# Patient Record
Sex: Female | Born: 1980 | Race: Black or African American | Hispanic: No | Marital: Married | Smoking: Never smoker
Health system: Southern US, Community
[De-identification: ages and names within clinical notes are randomized; demographics above are authoritative.]

## PROBLEM LIST (undated history)

## (undated) DIAGNOSIS — O24419 Gestational diabetes mellitus in pregnancy, unspecified control: Secondary | ICD-10-CM

---

## 2006-01-01 ENCOUNTER — Emergency Department (HOSPITAL_COMMUNITY): Admission: EM | Admit: 2006-01-01 | Discharge: 2006-01-01 | Payer: Self-pay | Admitting: Emergency Medicine

## 2006-12-01 ENCOUNTER — Encounter: Admission: RE | Admit: 2006-12-01 | Discharge: 2006-12-01 | Payer: Self-pay | Admitting: Obstetrics and Gynecology

## 2007-02-03 ENCOUNTER — Encounter (INDEPENDENT_AMBULATORY_CARE_PROVIDER_SITE_OTHER): Payer: Self-pay | Admitting: Obstetrics and Gynecology

## 2007-02-03 ENCOUNTER — Inpatient Hospital Stay (HOSPITAL_COMMUNITY): Admission: AD | Admit: 2007-02-03 | Discharge: 2007-02-06 | Payer: Self-pay | Admitting: *Deleted

## 2011-01-21 NOTE — Op Note (Signed)
NAMECHARDAI, Paula Rojas NO.:  [id number]   MEDICAL RECORD NO.:  [id number]          PATIENT TYPE:  INP   LOCATION:  9142                          FACILITY:  [facility]   PHYSICIAN:  Maxie Better, M.D.DATE OF BIRTH:  1981/07/30   DATE OF PROCEDURE:  02/03/2007  DATE OF DISCHARGE:                               OPERATIVE REPORT   PREOPERATIVE DIAGNOSES:  1. Fetal intolerance to labor.  2. Gestational diabetes.   PROCEDURE:  Primary cesarean section, Kerr hysterotomy.   POSTOPERATIVE DIAGNOSES:  1. Fetal intolerance to labor.  2. Direct occiput posterior presentation.  3. Class A1 gestational diabetes   ANESTHESIA:  Spinal.   SURGEON:  Maxie Better, M.D.   ASSISTANT:  Marlinda Mike, CNM   PROCEDURE:  Under adequate spinal anesthesia, the patient was placed in  the supine position with left lateral tilt, an indwelling Foley catheter  already in place.  The patient was sterilely prepped and draped in usual  fashion.  Pfannenstiel skin incision was then made, carried down to the  rectus fascia.  Rectus fascia was opened transversely.  Rectus fascia  was then bluntly and sharply dissected off the rectus muscle in superior  and inferior fashion.  Rectus muscles split in the midline.  The  parietal peritoneum was entered bluntly.  The vesicouterine peritoneum  was opened transversely.  The bladder was then bluntly dissected off the  uterine segment and displaced inferiorly.  A curvilinear low transverse  uterine incision was then made and extended with bandage scissors.  Subsequent delivery of a live female from the direct occiput presentation  was accomplished.  Cord around the neck times one reducible.  Baby had  both DeLee and bulb suction on the abdomen.  Cord was clamped and cut.  The baby was transferred to the awaiting pediatrician who assigned  Apgars of 8 and 9 at 1 and five minutes.  Placenta was spontaneous  intact, sent to pathology.  Uterine cavity  was cleaned of debris.  Uterine incision had no extension.  Uterine incision was closed in 2  layers, the first layer 0 Monocryl running locked stitch, second layer  imbricating using 0 Monocryl suture.  Figure-of-eight suture was placed  on the right angle for hemostasis.  The abdomen was then copiously  irrigated and suctioned of debris.  Normal tubes and ovaries were noted.  The uterine incision had good hemostasis.  The parietal peritoneum was  not closed.  The rectus fascia was closed with 0 Vicryl x2.  The  subcutaneous area was irrigated, small bleeders cauterized and  interrupted 2-0 plain sutures were placed.  Skin approximated using  Ethicon staples.  Specimen was placenta, sent to pathology.  Cord pH was  7.23.  Estimated blood loss was 600 mL.  Fluid  intraoperatively was 2 liters.  Urine output was 175 mL clear yellow  urine.  Sponge and instrument counts x2 were correct.  Complication was  none.  Weight of the baby was 6 pounds 2 ounces.  The patient tolerated  the procedure well and was transferred to recovery in stable condition.  Maxie Better, M.D.  Electronically Signed     [place]/MEDQ  D:  02/03/2007  T:  02/04/2007  Job:  [id number]

## 2011-01-24 NOTE — Discharge Summary (Signed)
NAMEAMARI, BURNSWORTH NO.:  [id number]   MEDICAL RECORD NO.:  [id number]          PATIENT TYPE:  INP   LOCATION:  9142                          [facility]  [facility]   PHYSICIAN:  Maxie Better, M.D.DATE OF BIRTH:  Aug 04, 1981   DATE OF ADMISSION:  02/03/2007  DATE OF DISCHARGE:  02/06/2007                               DISCHARGE SUMMARY   ADMISSION DIAGNOSIS:  Spontaneous rupture of membranes, meconium fluid,  term gestation, class A1 gestational diabetes.  Sickle cell trait.   DISCHARGE DIAGNOSES:  1. Term gestation delivered.  2. Sickle cell trait.  3. Class A1 gestational diabetes.  4. Fetal intolerance to labor.  5. Status post primary cesarean section.   PROCEDURES:  A primary cesarean section.   HISTORY OF PRESENT ILLNESS:  A 30 year old gravida 1, para 0 female at  39-1/[redacted] weeks gestation with known class A1 gestational diabetes.  Presented with spontaneous rupture of membranes and meconium fluid in  early labor.   HOSPITAL COURSE:  The patient was admitted. Spontaneous rupture of  membrane had been confirmed. Cervical exam was 3, 60%,  and -2.  Group B  strep culture was positive.  The patient was a class A1 gestational  diabetes.  The patient was placed on continuous monitoring. She  subsequently was having several fetal heart rate decelerations  associated with triplet contractions. Blood pressure elevation was  noted.  PIH labs were obtained which was subsequently normal.  Internal  scalp electrode was placed and infusion was started.  Pitocin was also  started to augment labor.  The patient had some variable decelerations  which was thought to be resolved after discontinuing the amnioinfusion.  An epidural was placed for labor management.  The patient subsequently  had large variable decelerations down to the 40s for which the patient  was notable need for a primary cesarean section due to fetal intolerance  to labor.  The patient was taken to the  operating room.  She underwent a  primary cesarean section.  The live female, direct OP, meconium fluid,  cord around neck x1, 6 pounds 2 ounces, Apgars of 8 and 9, cord pH was  obtained.  Weight of the baby was 6 pounds 2 ounces. Cord around the  neck x1 was noted.  The patient subsequently had an uncomplicated  postoperative course. By postop day #3 the patient was tolerating a  regular diet.  No evidence of infection.  Her CBC on postop day #1  showed a hemoglobin of 11.7, hematocrit 33.3, white count of 13.9,  platelet count 182,000.  The patient was deemed well to be discharged  home.   DISPOSITION:  Home.  Condition stable.   DISCHARGE MEDICATIONS:  1. Tylox 1-2 tablets every 4-6 hours p.r.n. pain.  2. Motrin 600 mg one p.o. q.6 hours p.r.n. pain.  3. Prenatal vitamins one p.o. daily.   DISCHARGE INSTRUCTIONS:  Per the postpartum booklet given. Follow-up  appointment in 6 weeks at [facility] and 8 weeks for 2-hour glucose  tolerance test. Discharge instructions per the postpartum booklet given.      Sheronette  Cherly Hensen, M.D.  Electronically Signed     [place]/MEDQ  D:  03/22/2007  T:  03/22/2007  Job:  [id number]

## 2011-08-08 LAB — OB RESULTS CONSOLE GC/CHLAMYDIA
Chlamydia: NEGATIVE
Gonorrhea: NEGATIVE

## 2011-10-22 ENCOUNTER — Encounter: Payer: BC Managed Care – PPO | Attending: Obstetrics and Gynecology | Admitting: *Deleted

## 2011-10-22 DIAGNOSIS — O99810 Abnormal glucose complicating pregnancy: Secondary | ICD-10-CM | POA: Insufficient documentation

## 2011-10-22 DIAGNOSIS — Z713 Dietary counseling and surveillance: Secondary | ICD-10-CM | POA: Insufficient documentation

## 2011-10-23 ENCOUNTER — Encounter: Payer: Self-pay | Admitting: *Deleted

## 2011-10-23 NOTE — Progress Notes (Signed)
  Patient was seen on 10/22/2011 for Gestational Diabetes self-management class at the [facility]. The following learning objectives were met by the patient during this course:   States the definition of Gestational Diabetes  States why dietary management is important in controlling blood glucose  Describes the effects each nutrient has on blood glucose levels  Demonstrates ability to create a balanced meal plan  Demonstrates carbohydrate counting   States when to check blood glucose levels  Demonstrates proper blood glucose monitoring techniques  States the effect of stress and exercise on blood glucose levels  States the importance of limiting caffeine and abstaining from alcohol and smoking  Blood glucose monitor was not given as patient states she still has Freestyle Meter from last pregnancy Blood glucose reading: 65 mg/dl  Patient instructed to monitor glucose levels: FBS: 60 - <90 2 hour: <120  *Patient received handouts:  Nutrition Diabetes and Pregnancy  Carbohydrate Counting List  Patient will be seen for follow-up as needed.

## 2011-10-23 NOTE — Patient Instructions (Signed)
Goals:  Check glucose levels per MD as instructed  Follow Gestational Diabetes Diet as instructed  Call for follow-up as needed    

## 2011-12-02 DIAGNOSIS — O24419 Gestational diabetes mellitus in pregnancy, unspecified control: Secondary | ICD-10-CM | POA: Insufficient documentation

## 2011-12-02 DIAGNOSIS — Z8632 Personal history of gestational diabetes: Secondary | ICD-10-CM | POA: Insufficient documentation

## 2011-12-18 LAB — OB RESULTS CONSOLE ABO/RH: RH Type: POSITIVE

## 2011-12-18 LAB — OB RESULTS CONSOLE HEPATITIS B SURFACE ANTIGEN: Hepatitis B Surface Ag: NEGATIVE

## 2011-12-18 LAB — OB RESULTS CONSOLE RUBELLA ANTIBODY, IGM: Rubella: IMMUNE

## 2011-12-18 LAB — OB RESULTS CONSOLE HIV ANTIBODY (ROUTINE TESTING): HIV: NONREACTIVE

## 2011-12-18 LAB — OB RESULTS CONSOLE ANTIBODY SCREEN: Antibody Screen: NEGATIVE

## 2011-12-18 LAB — OB RESULTS CONSOLE RPR: RPR: NONREACTIVE

## 2012-02-26 ENCOUNTER — Encounter (HOSPITAL_COMMUNITY): Payer: Self-pay | Admitting: Anesthesiology

## 2012-02-26 ENCOUNTER — Encounter (HOSPITAL_COMMUNITY): Payer: Self-pay | Admitting: *Deleted

## 2012-02-26 ENCOUNTER — Inpatient Hospital Stay (HOSPITAL_COMMUNITY)
Admission: AD | Admit: 2012-02-26 | Discharge: 2012-02-29 | DRG: 371 | Disposition: A | Discharge status: Home / Self Care | Payer: BC Managed Care – PPO | Source: Ambulatory Visit | Attending: Obstetrics and Gynecology | Admitting: Obstetrics and Gynecology

## 2012-02-26 DIAGNOSIS — O4590 Premature separation of placenta, unspecified, unspecified trimester: Secondary | ICD-10-CM | POA: Diagnosis present

## 2012-02-26 DIAGNOSIS — O99814 Abnormal glucose complicating childbirth: Secondary | ICD-10-CM | POA: Diagnosis present

## 2012-02-26 DIAGNOSIS — Z22330 Carrier of Group B streptococcus: Secondary | ICD-10-CM

## 2012-02-26 DIAGNOSIS — O99892 Other specified diseases and conditions complicating childbirth: Secondary | ICD-10-CM | POA: Diagnosis present

## 2012-02-26 DIAGNOSIS — O34219 Maternal care for unspecified type scar from previous cesarean delivery: Secondary | ICD-10-CM | POA: Diagnosis present

## 2012-02-26 DIAGNOSIS — E669 Obesity, unspecified: Secondary | ICD-10-CM | POA: Diagnosis present

## 2012-02-26 HISTORY — DX: Gestational diabetes mellitus in pregnancy, unspecified control: O24.419

## 2012-02-26 LAB — CBC
HCT: 38.1 % (ref 36.0–46.0)
Hemoglobin: 13.2 g/dL (ref 12.0–15.0)
MCH: 26.7 pg (ref 26.0–34.0)
MCHC: 34.6 g/dL (ref 30.0–36.0)
MCV: 77 fL — ABNORMAL LOW (ref 78.0–100.0)
RBC: 4.95 MIL/uL (ref 3.87–5.11)

## 2012-02-26 MED ORDER — IBUPROFEN 600 MG PO TABS: 600.0000 mg | ORAL_TABLET | Freq: Four times a day (QID) | ORAL | Status: DC | PRN

## 2012-02-26 MED ORDER — LIDOCAINE HCL (PF) 1 % IJ SOLN: 30.0000 mL | INTRAMUSCULAR | Status: DC | PRN

## 2012-02-26 MED ORDER — OXYCODONE-ACETAMINOPHEN 5-325 MG PO TABS: 1.0000 | ORAL_TABLET | ORAL | Status: DC | PRN

## 2012-02-26 MED ORDER — FLEET ENEMA 7-19 GM/118ML RE ENEM: 1.0000 | ENEMA | RECTAL | Status: DC | PRN

## 2012-02-26 MED ORDER — PENICILLIN G POTASSIUM 5000000 UNITS IJ SOLR
2.5000 10*6.[IU] | INTRAVENOUS | Status: DC
  Filled 2012-02-26 (×4): qty 2.5

## 2012-02-26 MED ORDER — ONDANSETRON HCL 4 MG/2ML IJ SOLN: 4.0000 mg | Freq: Four times a day (QID) | INTRAMUSCULAR | Status: DC | PRN

## 2012-02-26 MED ORDER — ACETAMINOPHEN 325 MG PO TABS: 650.0000 mg | ORAL_TABLET | ORAL | Status: DC | PRN

## 2012-02-26 MED ORDER — LACTATED RINGERS IV SOLN: 500.0000 mL | INTRAVENOUS | Status: DC | PRN

## 2012-02-26 MED ORDER — CITRIC ACID-SODIUM CITRATE 334-500 MG/5ML PO SOLN
30.0000 mL | ORAL | Status: DC | PRN
  Administered 2012-02-27: 30 mL via ORAL

## 2012-02-26 MED ORDER — EPHEDRINE 5 MG/ML INJ
10.0000 mg | INTRAVENOUS | Status: DC | PRN
  Filled 2012-02-26: qty 4

## 2012-02-26 MED ORDER — OXYTOCIN BOLUS FROM INFUSION
250.0000 mL | Freq: Once | INTRAVENOUS | Status: DC
  Filled 2012-02-26: qty 500

## 2012-02-26 MED ORDER — PENICILLIN G POTASSIUM 5000000 UNITS IJ SOLR
5.0000 10*6.[IU] | Freq: Once | INTRAVENOUS | Status: DC
  Administered 2012-02-26: 5 10*6.[IU] via INTRAVENOUS
  Filled 2012-02-26: qty 5

## 2012-02-26 MED ORDER — DIPHENHYDRAMINE HCL 50 MG/ML IJ SOLN: 12.5000 mg | INTRAMUSCULAR | Status: DC | PRN

## 2012-02-26 MED ORDER — FENTANYL 2.5 MCG/ML BUPIVACAINE 1/10 % EPIDURAL INFUSION (WH - ANES)
14.0000 mL/h | INTRAMUSCULAR | Status: DC
  Filled 2012-02-26: qty 60

## 2012-02-26 MED ORDER — FENTANYL 2.5 MCG/ML BUPIVACAINE 1/10 % EPIDURAL INFUSION (WH - ANES)
INTRAMUSCULAR | Status: DC | PRN
  Administered 2012-02-26: 12 mL/h via EPIDURAL

## 2012-02-26 MED ORDER — LACTATED RINGERS IV SOLN: 500.0000 mL | Freq: Once | INTRAVENOUS | Status: DC

## 2012-02-26 MED ORDER — PHENYLEPHRINE 40 MCG/ML (10ML) SYRINGE FOR IV PUSH (FOR BLOOD PRESSURE SUPPORT)
80.0000 ug | PREFILLED_SYRINGE | INTRAVENOUS | Status: DC | PRN
  Filled 2012-02-26: qty 5

## 2012-02-26 MED ORDER — LACTATED RINGERS IV SOLN
INTRAVENOUS | Status: DC
  Administered 2012-02-26: 23:00:00 via INTRAVENOUS

## 2012-02-26 MED ORDER — OXYTOCIN 40 UNITS IN LACTATED RINGERS INFUSION - SIMPLE MED: 62.5000 mL/h | Freq: Once | INTRAVENOUS | Status: DC

## 2012-02-26 MED ORDER — EPHEDRINE 5 MG/ML INJ
10.0000 mg | INTRAVENOUS | Status: DC | PRN
  Administered 2012-02-27: 10 mg via INTRAVENOUS

## 2012-02-26 MED ORDER — PHENYLEPHRINE 40 MCG/ML (10ML) SYRINGE FOR IV PUSH (FOR BLOOD PRESSURE SUPPORT): 80.0000 ug | PREFILLED_SYRINGE | INTRAVENOUS | Status: DC | PRN

## 2012-02-26 MED ORDER — LIDOCAINE HCL (PF) 1 % IJ SOLN
INTRAMUSCULAR | Status: DC | PRN
  Administered 2012-02-26: 4 mL
  Administered 2012-02-26: 3 mL

## 2012-02-26 NOTE — MAU Note (Signed)
Contractions since 2pm every 2 minutes apart. No vaginal bleeding

## 2012-02-26 NOTE — Anesthesia Procedure Notes (Signed)
Epidural Patient location during procedure: OB Start time: 02/26/2012 11:36 PM  Staffing Anesthesiologist: Marcellis Frampton A. Performed by: anesthesiologist   Preanesthetic Checklist Completed: patient identified, site marked, surgical consent, pre-op evaluation, timeout performed, IV checked, risks and benefits discussed and monitors and equipment checked  Epidural Patient position: sitting Prep: site prepped and draped and DuraPrep Patient monitoring: continuous pulse ox and blood pressure Approach: midline Injection technique: LOR air  Needle:  Needle type: Tuohy  Needle gauge: 17 G Needle length: 9 cm Needle insertion depth: 6 cm Catheter type: closed end flexible Catheter size: 19 Gauge Catheter at skin depth: 12 cm Test dose: negative and Other  Assessment Events: blood not aspirated, injection not painful, no injection resistance, negative IV test and no paresthesia  Additional Notes Patient identified. Risks and benefits discussed including failed block, incomplete  Pain control, post dural puncture headache, nerve damage, paralysis, blood pressure Changes, nausea, vomiting, reactions to medications-both toxic and allergic and post Partum back pain. All questions were answered. Patient expressed understanding and wished to proceed. Sterile technique was used throughout procedure. Epidural site was Dressed with sterile barrier dressing. No paresthesias, signs of intravascular injection Or signs of intrathecal spread were encountered.  Patient was more comfortable after the epidural was dosed. Please see RN's note for documentation of vital signs and FHR which are stable.

## 2012-02-26 NOTE — Anesthesia Preprocedure Evaluation (Addendum)
Anesthesia Evaluation  Patient identified by MRN, date of birth, ID band Patient awake    Reviewed: Allergy & Precautions, H&P , Patient's Chart, lab work & pertinent test results  Airway Mallampati: III TM Distance: >3 FB Neck ROM: full    Dental No notable dental hx. (+) Teeth Intact   Pulmonary neg pulmonary ROS,  breath sounds clear to auscultation  Pulmonary exam normal       Cardiovascular negative cardio ROS  Rhythm:regular Rate:Normal     Neuro/Psych negative neurological ROS  negative psych ROS   GI/Hepatic negative GI ROS, Neg liver ROS,   Endo/Other  Diabetes mellitus-, Well Controlled, GestationalMorbid obesity  Renal/GU negative Renal ROS  negative genitourinary   Musculoskeletal   Abdominal Normal abdominal exam  (+)   Peds  Hematology negative hematology ROS (+)   Anesthesia Other Findings   Reproductive/Obstetrics (+) Pregnancy                           Anesthesia Physical Anesthesia Plan  ASA: III and Emergent  Anesthesia Plan: Epidural   Post-op Pain Management:    Induction:   Airway Management Planned:   Additional Equipment:   Intra-op Plan:   Post-operative Plan:   Informed Consent: I have reviewed the patients History and Physical, chart, labs and discussed the procedure including the risks, benefits and alternatives for the proposed anesthesia with the patient or authorized representative who has indicated his/her understanding and acceptance.     Plan Discussed with: Anesthesiologist  Anesthesia Plan Comments:        Anesthesia Quick Evaluation

## 2012-02-27 ENCOUNTER — Encounter (HOSPITAL_COMMUNITY): Payer: Self-pay | Admitting: Anesthesiology

## 2012-02-27 ENCOUNTER — Encounter (HOSPITAL_COMMUNITY)
Admission: AD | Disposition: A | Discharge status: Home / Self Care | Payer: Self-pay | Source: Ambulatory Visit | Attending: Obstetrics and Gynecology

## 2012-02-27 ENCOUNTER — Encounter (HOSPITAL_COMMUNITY): Payer: Self-pay | Admitting: *Deleted

## 2012-02-27 ENCOUNTER — Inpatient Hospital Stay (HOSPITAL_COMMUNITY): Payer: BC Managed Care – PPO | Admitting: Anesthesiology

## 2012-02-27 LAB — ABO/RH: ABO/RH(D): O POS

## 2012-02-27 LAB — RPR: RPR Ser Ql: NONREACTIVE

## 2012-02-27 SURGERY — Surgical Case
Anesthesia: Regional | Site: Abdomen | Wound class: Clean Contaminated

## 2012-02-27 MED ORDER — MORPHINE SULFATE (PF) 0.5 MG/ML IJ SOLN
INTRAMUSCULAR | Status: DC | PRN
  Administered 2012-02-27: 2 mg via INTRAVENOUS
  Administered 2012-02-27: 3 mg via EPIDURAL

## 2012-02-27 MED ORDER — MORPHINE SULFATE 0.5 MG/ML IJ SOLN
INTRAMUSCULAR | Status: AC
  Filled 2012-02-27: qty 10

## 2012-02-27 MED ORDER — KETOROLAC TROMETHAMINE 30 MG/ML IJ SOLN: 30.0000 mg | Freq: Four times a day (QID) | INTRAMUSCULAR | Status: DC | PRN

## 2012-02-27 MED ORDER — KETOROLAC TROMETHAMINE 30 MG/ML IJ SOLN
30.0000 mg | Freq: Four times a day (QID) | INTRAMUSCULAR | Status: DC | PRN
  Administered 2012-02-27: 30 mg via INTRAVENOUS

## 2012-02-27 MED ORDER — OXYTOCIN 40 UNITS IN LACTATED RINGERS INFUSION - SIMPLE MED
62.5000 mL/h | INTRAVENOUS | Status: AC
  Administered 2012-02-27: 62.5 mL/h via INTRAVENOUS
  Filled 2012-02-27: qty 1000

## 2012-02-27 MED ORDER — NALBUPHINE HCL 10 MG/ML IJ SOLN
5.0000 mg | INTRAMUSCULAR | Status: DC | PRN
  Filled 2012-02-27: qty 1

## 2012-02-27 MED ORDER — WITCH HAZEL-GLYCERIN EX PADS: 1.0000 "application " | MEDICATED_PAD | CUTANEOUS | Status: DC | PRN

## 2012-02-27 MED ORDER — MEPERIDINE HCL 25 MG/ML IJ SOLN
INTRAMUSCULAR | Status: AC
  Filled 2012-02-27: qty 1

## 2012-02-27 MED ORDER — FLEET ENEMA 7-19 GM/118ML RE ENEM: 1.0000 | ENEMA | Freq: Every day | RECTAL | Status: DC | PRN

## 2012-02-27 MED ORDER — BISACODYL 10 MG RE SUPP: 10.0000 mg | Freq: Every day | RECTAL | Status: DC | PRN

## 2012-02-27 MED ORDER — SIMETHICONE 80 MG PO CHEW
80.0000 mg | CHEWABLE_TABLET | ORAL | Status: DC | PRN
  Administered 2012-02-28: 80 mg via ORAL

## 2012-02-27 MED ORDER — NALOXONE HCL 0.4 MG/ML IJ SOLN
1.0000 ug/kg/h | INTRAMUSCULAR | Status: DC | PRN
  Filled 2012-02-27: qty 2.5

## 2012-02-27 MED ORDER — ONDANSETRON HCL 4 MG/2ML IJ SOLN
INTRAMUSCULAR | Status: DC | PRN
  Administered 2012-02-27: 4 mg via INTRAVENOUS

## 2012-02-27 MED ORDER — DIPHENHYDRAMINE HCL 50 MG/ML IJ SOLN: 12.5000 mg | INTRAMUSCULAR | Status: DC | PRN

## 2012-02-27 MED ORDER — ONDANSETRON HCL 4 MG/2ML IJ SOLN
INTRAMUSCULAR | Status: AC
  Filled 2012-02-27: qty 2

## 2012-02-27 MED ORDER — SCOPOLAMINE 1 MG/3DAYS TD PT72
1.0000 | MEDICATED_PATCH | Freq: Once | TRANSDERMAL | Status: DC
  Administered 2012-02-27: 1.5 mg via TRANSDERMAL

## 2012-02-27 MED ORDER — MENTHOL 3 MG MT LOZG: 1.0000 | LOZENGE | OROMUCOSAL | Status: DC | PRN

## 2012-02-27 MED ORDER — ZOLPIDEM TARTRATE 5 MG PO TABS: 5.0000 mg | ORAL_TABLET | Freq: Every evening | ORAL | Status: DC | PRN

## 2012-02-27 MED ORDER — SIMETHICONE 80 MG PO CHEW
80.0000 mg | CHEWABLE_TABLET | Freq: Three times a day (TID) | ORAL | Status: DC
  Administered 2012-02-27 – 2012-02-29 (×8): 80 mg via ORAL

## 2012-02-27 MED ORDER — IBUPROFEN 600 MG PO TABS
600.0000 mg | ORAL_TABLET | Freq: Four times a day (QID) | ORAL | Status: DC
  Administered 2012-02-27 – 2012-02-29 (×9): 600 mg via ORAL
  Filled 2012-02-27 (×5): qty 1

## 2012-02-27 MED ORDER — CEFAZOLIN SODIUM 1-5 GM-% IV SOLN
INTRAVENOUS | Status: DC | PRN
  Administered 2012-02-27: 2 g via INTRAVENOUS

## 2012-02-27 MED ORDER — MEASLES, MUMPS & RUBELLA VAC ~~LOC~~ INJ
0.5000 mL | INJECTION | Freq: Once | SUBCUTANEOUS | Status: DC
  Filled 2012-02-27: qty 0.5

## 2012-02-27 MED ORDER — CEFAZOLIN SODIUM 1-5 GM-% IV SOLN
INTRAVENOUS | Status: AC
  Filled 2012-02-27: qty 100

## 2012-02-27 MED ORDER — MEPERIDINE HCL 25 MG/ML IJ SOLN
6.2500 mg | INTRAMUSCULAR | Status: DC | PRN
  Administered 2012-02-27: 6.25 mg via INTRAVENOUS

## 2012-02-27 MED ORDER — SODIUM BICARBONATE 8.4 % IV SOLN
INTRAVENOUS | Status: DC | PRN
  Administered 2012-02-27: 5 mL via EPIDURAL

## 2012-02-27 MED ORDER — CEFAZOLIN SODIUM 1-5 GM-% IV SOLN
1.0000 g | Freq: Three times a day (TID) | INTRAVENOUS | Status: AC
  Administered 2012-02-27 (×2): 1 g via INTRAVENOUS
  Filled 2012-02-27 (×2): qty 50

## 2012-02-27 MED ORDER — ONDANSETRON HCL 4 MG/2ML IJ SOLN: 4.0000 mg | Freq: Three times a day (TID) | INTRAMUSCULAR | Status: DC | PRN

## 2012-02-27 MED ORDER — OXYCODONE-ACETAMINOPHEN 5-325 MG PO TABS
1.0000 | ORAL_TABLET | ORAL | Status: DC | PRN
  Administered 2012-02-27 – 2012-02-29 (×2): 1 via ORAL
  Filled 2012-02-27 (×2): qty 1

## 2012-02-27 MED ORDER — NALOXONE HCL 0.4 MG/ML IJ SOLN: 0.4000 mg | INTRAMUSCULAR | Status: DC | PRN

## 2012-02-27 MED ORDER — OXYTOCIN 40 UNITS IN LACTATED RINGERS INFUSION - SIMPLE MED
INTRAVENOUS | Status: DC | PRN
  Administered 2012-02-27: 40 [IU] via INTRAVENOUS

## 2012-02-27 MED ORDER — KETOROLAC TROMETHAMINE 30 MG/ML IJ SOLN
INTRAMUSCULAR | Status: AC
  Filled 2012-02-27: qty 1

## 2012-02-27 MED ORDER — METOCLOPRAMIDE HCL 5 MG/ML IJ SOLN: 10.0000 mg | Freq: Three times a day (TID) | INTRAMUSCULAR | Status: DC | PRN

## 2012-02-27 MED ORDER — DIPHENHYDRAMINE HCL 25 MG PO CAPS: 25.0000 mg | ORAL_CAPSULE | ORAL | Status: DC | PRN

## 2012-02-27 MED ORDER — ONDANSETRON HCL 4 MG/2ML IJ SOLN: 4.0000 mg | INTRAMUSCULAR | Status: DC | PRN

## 2012-02-27 MED ORDER — CITRIC ACID-SODIUM CITRATE 334-500 MG/5ML PO SOLN
ORAL | Status: AC
  Filled 2012-02-27: qty 15

## 2012-02-27 MED ORDER — TERBUTALINE SULFATE 1 MG/ML IJ SOLN
INTRAMUSCULAR | Status: AC
  Administered 2012-02-27: 0.25 mg via SUBCUTANEOUS
  Filled 2012-02-27: qty 1

## 2012-02-27 MED ORDER — PRENATAL MULTIVITAMIN CH
1.0000 | ORAL_TABLET | Freq: Every day | ORAL | Status: DC
  Administered 2012-02-27 – 2012-02-29 (×3): 1 via ORAL
  Filled 2012-02-27 (×3): qty 1

## 2012-02-27 MED ORDER — TETANUS-DIPHTH-ACELL PERTUSSIS 5-2.5-18.5 LF-MCG/0.5 IM SUSP
0.5000 mL | Freq: Once | INTRAMUSCULAR | Status: AC
  Administered 2012-02-28: 0.5 mL via INTRAMUSCULAR
  Filled 2012-02-27: qty 0.5

## 2012-02-27 MED ORDER — DIPHENHYDRAMINE HCL 50 MG/ML IJ SOLN: 25.0000 mg | INTRAMUSCULAR | Status: DC | PRN

## 2012-02-27 MED ORDER — SODIUM BICARBONATE 8.4 % IV SOLN
INTRAVENOUS | Status: AC
  Filled 2012-02-27: qty 50

## 2012-02-27 MED ORDER — SCOPOLAMINE 1 MG/3DAYS TD PT72
MEDICATED_PATCH | TRANSDERMAL | Status: AC
  Filled 2012-02-27: qty 1

## 2012-02-27 MED ORDER — LANOLIN HYDROUS EX OINT: 1.0000 "application " | TOPICAL_OINTMENT | CUTANEOUS | Status: DC | PRN

## 2012-02-27 MED ORDER — METHYLERGONOVINE MALEATE 0.2 MG/ML IJ SOLN: 0.2000 mg | INTRAMUSCULAR | Status: DC | PRN

## 2012-02-27 MED ORDER — LIDOCAINE-EPINEPHRINE (PF) 2 %-1:200000 IJ SOLN
INTRAMUSCULAR | Status: AC
  Filled 2012-02-27: qty 20

## 2012-02-27 MED ORDER — DIBUCAINE 1 % RE OINT: 1.0000 "application " | TOPICAL_OINTMENT | RECTAL | Status: DC | PRN

## 2012-02-27 MED ORDER — METHYLERGONOVINE MALEATE 0.2 MG PO TABS: 0.2000 mg | ORAL_TABLET | ORAL | Status: DC | PRN

## 2012-02-27 MED ORDER — LACTATED RINGERS IV SOLN
INTRAVENOUS | Status: DC | PRN
  Administered 2012-02-27 (×2): via INTRAVENOUS

## 2012-02-27 MED ORDER — OXYTOCIN 10 UNIT/ML IJ SOLN
INTRAMUSCULAR | Status: AC
  Filled 2012-02-27: qty 4

## 2012-02-27 MED ORDER — SODIUM CHLORIDE 0.9 % IV SOLN: 250.0000 mL | INTRAVENOUS | Status: DC

## 2012-02-27 MED ORDER — FENTANYL CITRATE 0.05 MG/ML IJ SOLN: 25.0000 ug | INTRAMUSCULAR | Status: DC | PRN

## 2012-02-27 MED ORDER — SODIUM CHLORIDE 0.9 % IJ SOLN: 3.0000 mL | INTRAMUSCULAR | Status: DC | PRN

## 2012-02-27 MED ORDER — IBUPROFEN 600 MG PO TABS
600.0000 mg | ORAL_TABLET | Freq: Four times a day (QID) | ORAL | Status: DC | PRN
  Filled 2012-02-27 (×4): qty 1

## 2012-02-27 MED ORDER — SODIUM CHLORIDE 0.9 % IJ SOLN
3.0000 mL | Freq: Two times a day (BID) | INTRAMUSCULAR | Status: DC
  Administered 2012-02-27: 3 mL via INTRAVENOUS

## 2012-02-27 MED ORDER — SENNOSIDES-DOCUSATE SODIUM 8.6-50 MG PO TABS
2.0000 | ORAL_TABLET | Freq: Every day | ORAL | Status: DC
  Administered 2012-02-27 – 2012-02-28 (×2): 2 via ORAL

## 2012-02-27 MED ORDER — DIPHENHYDRAMINE HCL 25 MG PO CAPS: 25.0000 mg | ORAL_CAPSULE | Freq: Four times a day (QID) | ORAL | Status: DC | PRN

## 2012-02-27 MED ORDER — ONDANSETRON HCL 4 MG PO TABS: 4.0000 mg | ORAL_TABLET | ORAL | Status: DC | PRN

## 2012-02-27 SURGICAL SUPPLY — 46 items
APL SKNCLS STERI-STRIP NONHPOA (GAUZE/BANDAGES/DRESSINGS)
BARRIER ADHS 3X4 INTERCEED (GAUZE/BANDAGES/DRESSINGS) ×1 IMPLANT
BENZOIN TINCTURE PRP APPL 2/3 (GAUZE/BANDAGES/DRESSINGS) IMPLANT
BRR ADH 4X3 ABS CNTRL BYND (GAUZE/BANDAGES/DRESSINGS) ×1
CHLORAPREP W/TINT 26ML (MISCELLANEOUS) ×2 IMPLANT
CLOTH BEACON ORANGE TIMEOUT ST (SAFETY) ×2 IMPLANT
CONTAINER PREFILL 10% NBF 15ML (MISCELLANEOUS) IMPLANT
DRESSING TELFA 8X3 (GAUZE/BANDAGES/DRESSINGS) ×2 IMPLANT
DRSG COVADERM 4X10 (GAUZE/BANDAGES/DRESSINGS) ×1 IMPLANT
ELECT REM PT RETURN 9FT ADLT (ELECTROSURGICAL) ×2
ELECTRODE REM PT RTRN 9FT ADLT (ELECTROSURGICAL) ×1 IMPLANT
EXTRACTOR VACUUM KIWI (MISCELLANEOUS) IMPLANT
EXTRACTOR VACUUM M CUP 4 TUBE (SUCTIONS) IMPLANT
GAUZE SPONGE 4X4 12PLY STRL LF (GAUZE/BANDAGES/DRESSINGS) ×4 IMPLANT
GLOVE BIO SURGEON STRL SZ 6.5 (GLOVE) ×2 IMPLANT
GLOVE BIOGEL PI IND STRL 7.0 (GLOVE) ×2 IMPLANT
GLOVE BIOGEL PI INDICATOR 7.0 (GLOVE) ×2
GOWN PREVENTION PLUS LG XLONG (DISPOSABLE) ×6 IMPLANT
KIT ABG SYR 3ML LUER SLIP (SYRINGE) ×1 IMPLANT
NDL HYPO 25X1 1.5 SAFETY (NEEDLE) ×1 IMPLANT
NDL HYPO 25X5/8 SAFETYGLIDE (NEEDLE) IMPLANT
NEEDLE HYPO 25X1 1.5 SAFETY (NEEDLE) ×2 IMPLANT
NEEDLE HYPO 25X5/8 SAFETYGLIDE (NEEDLE) IMPLANT
NS IRRIG 1000ML POUR BTL (IV SOLUTION) ×2 IMPLANT
PACK C SECTION WH (CUSTOM PROCEDURE TRAY) ×2 IMPLANT
PAD ABD 7.5X8 STRL (GAUZE/BANDAGES/DRESSINGS) ×2 IMPLANT
RTRCTR C-SECT PINK 25CM LRG (MISCELLANEOUS) ×1 IMPLANT
SLEEVE SCD COMPRESS KNEE MED (MISCELLANEOUS) IMPLANT
STAPLER VISISTAT 35W (STAPLE) ×1 IMPLANT
STRIP CLOSURE SKIN 1/2X4 (GAUZE/BANDAGES/DRESSINGS) IMPLANT
SUT CHROMIC GUT AB #0 18 (SUTURE) IMPLANT
SUT MNCRL 0 VIOLET CTX 36 (SUTURE) ×3 IMPLANT
SUT MON AB 4-0 PS1 27 (SUTURE) IMPLANT
SUT MONOCRYL 0 CTX 36 (SUTURE) ×3
SUT PLAIN 2 0 (SUTURE)
SUT PLAIN 2 0 XLH (SUTURE) ×1 IMPLANT
SUT PLAIN ABS 2-0 CT1 27XMFL (SUTURE) IMPLANT
SUT VIC AB 0 CT1 27 (SUTURE) ×4
SUT VIC AB 0 CT1 27XBRD ANBCTR (SUTURE) ×2 IMPLANT
SUT VIC AB 0 CT1 36 (SUTURE) ×1 IMPLANT
SUT VIC AB 2-0 CT1 27 (SUTURE) ×2
SUT VIC AB 2-0 CT1 TAPERPNT 27 (SUTURE) ×1 IMPLANT
SYR CONTROL 10ML LL (SYRINGE) ×2 IMPLANT
TOWEL OR 17X24 6PK STRL BLUE (TOWEL DISPOSABLE) ×4 IMPLANT
TRAY FOLEY CATH 14FR (SET/KITS/TRAYS/PACK) ×1 IMPLANT
WATER STERILE IRR 1000ML POUR (IV SOLUTION) ×2 IMPLANT

## 2012-02-27 NOTE — H&P (Signed)
Paula Rojas is a 31 y.o. female now @ 35 5/7 weeks presenting for admission due to early labor and SROM clear fluid in MAU. PNC notable for previous LTCS . Desires VBAC. Per MAU nurse, pt was 2 cm dilated. She had been seen in office earlier for ctx and was closed/long/OOP. (+) GBS History OB History    Grav Para Term Preterm Abortions TAB SAB Ect Mult Living   2 2 2       2      Past Medical History  Diagnosis Date  . Gestational diabetes    Past Surgical History  Procedure Date  . Cesarean section    Family History: family history is not on file. Social History:  reports that she has never smoked. She has never used smokeless tobacco. She reports that she does not drink alcohol or use illicit drugs.   Prenatal Transfer Tool  Maternal Diabetes: Yes:  Diabetes Type:  Diet controlled Genetic Screening: Normal Maternal Ultrasounds/Referrals: Normal Fetal Ultrasounds or other Referrals:  None Maternal Substance Abuse:  No Significant Maternal Medications:  None Significant Maternal Lab Results:  Lab values include: Group B Strep positive Other Comments:  None  ROS neg  Dilation: 7 Effacement (%): 100 Station: -1 Exam by:: dr Gavino Fouch Blood pressure 101/50, pulse 71, temperature 98.4 F (36.9 C), temperature source Oral, resp. rate 21, height 5' (1.524 m), weight 108.41 kg (239 lb), SpO2 100.00%, unknown if currently breastfeeding. Maternal Exam:  Uterine Assessment: Contraction strength is moderate.  Contraction frequency is irregular.   Abdomen: Patient reports no abdominal tenderness. Surgical scars: low transverse.   Estimated fetal weight is 7 lb.   Fetal presentation: vertex  Introitus: Amniotic fluid character: clear.     Physical Exam  Constitutional: She is oriented to person, place, and time. She appears well-developed and well-nourished.  HENT:  Head: Atraumatic.  Neck: Neck supple.  Cardiovascular: Regular rhythm.   Respiratory: Breath sounds normal.    GI: Soft.  Musculoskeletal: She exhibits no edema.  Neurological: She is alert and oriented to person, place, and time.  Skin: Skin is warm and dry.  Psychiatric: She has a normal mood and affect.    Prenatal labs: ABO, Rh: O/Positive/-- (04/11 0000) Antibody: Negative (04/11 0000) Rubella: Immune (04/11 0000) RPR: Nonreactive (04/11 0000)  HBsAg: Negative (04/11 0000)  HIV: Non-reactive (04/11 0000)  GBS: Positive (12/07 0000)   Assessment/Plan: Previous LTCS desires VBAC SROM  GBS cx positive Active labor Class A1 GDM P admit. )Epidural. ISE. Amnioinfusion for variables routine labs. PCN prophylaxis   Paula Rojas A 02/27/2012, 1:59 AM

## 2012-02-27 NOTE — Consult Note (Addendum)
Neonatology Note:   Attendance at C-section:    I was called from the OR at time of skin incision and asked to attend this Stat primary C/S at term due to pronlonged fetal bradycardia. The mother is a G2P1 O pos, GBS pos with diet-controlled GDM. ROM 2 hours prior to delivery, fluid clear at that time, but bloody with some clot present in OR (Dr. Cherly Hensen felt there was an abruption). Pen G had been ordered about 1 hour prior to delivery, but may not have been received? Mother afebrile prior to delivery. Infant delivered vertex and had a weak spontaneous cry with stimulation provided by Dr. Cherly Hensen on the abdomen. Tone floppy, but HR good, color pink, and breathing regularly. Neededl bulb suctioning times 1 and continued stimulation. Ap 6/9. Lungs clear to ausc in DR. Tone approaching normal by 6-7 minutes of life. Allowed to stay for 10-15 minutes for skin-to-skin time, but advised to go to CN after that for glucose check due to perinatal stress and being IDM. Informed parents of this. Transferred to care of Pediatrician.   Deatra James, MD

## 2012-02-27 NOTE — Transfer of Care (Signed)
Immediate Anesthesia Transfer of Care Note  Patient: Paula Rojas  Procedure(s) Performed: Procedure(s) (LRB): CESAREAN SECTION (N/A)  Patient Location: PACU  Anesthesia Type: Epidural  Level of Consciousness: awake  Airway & Oxygen Therapy: Patient Spontanous Breathing  Post-op Assessment: Report given to PACU RN and Post -op Vital signs reviewed and stable  Post vital signs: stable  Complications: No apparent anesthesia complications

## 2012-02-27 NOTE — Anesthesia Postprocedure Evaluation (Signed)
  Anesthesia Post-op Note  Patient: Paula Rojas  Procedure(s) Performed: Procedure(s) (LRB): CESAREAN SECTION (N/A)  Patient Location: PACU  Anesthesia Type: Epidural  Level of Consciousness: awake, alert  and oriented  Airway and Oxygen Therapy: Patient Spontanous Breathing  Post-op Pain: none  Post-op Assessment: Post-op Vital signs reviewed, Patient's Cardiovascular Status Stable, Respiratory Function Stable, Patent Airway, No signs of Nausea or vomiting, Pain level controlled, No headache and No backache  Post-op Vital Signs: Reviewed and stable  Complications: No apparent anesthesia complications

## 2012-02-27 NOTE — Brief Op Note (Signed)
02/26/2012 - 02/27/2012  1:46 AM  PATIENT:  Paula Rojas  31 y.o. female  PRE-OPERATIVE DIAGNOSIS:  non reasuring fetal heart rate Previous Cesarean section, Class A1 GDM,   POST-OPERATIVE DIAGNOSIS:  non reasuring fetal heart rate placental Abruption, Class A1 GDM, previous cesarean section  PROCEDURE:  Procedure(s) (LRB): EMERGENCY REPEAT LOW TRANSVERSE CESAREAN SECTION (N/A), LYSIS OF ADHESIONS  SURGEON:  Surgeon(s) and Role:    * Liliah Dorian A Ludy Messamore, MD - Primary  PHYSICIAN ASSISTANT:   ASSISTANTS: none   ANESTHESIA:   epidural  EBL:  Total I/O In: 1000 [I.V.:1000] Out: 575 [Urine:75; Blood:500] FINDINGS; bloody amniotic fluid, vtx live female infant, nl tubes and ovaries. Omentum adherent to bladder peritoneum and ant abdominal wall. Apgar 6/9 BLOOD ADMINISTERED:none  DRAINS: none   LOCAL MEDICATIONS USED:  NONE  SPECIMEN:  Source of Specimen:  placenta  DISPOSITION OF SPECIMEN:  PATHOLOGY  COUNTS:  YES  TOURNIQUET:  * No tourniquets in log *  DICTATION: .Other Dictation: Dictation Number [id number]  PLAN OF CARE: Admit to inpatient   PATIENT DISPOSITION:  PACU - hemodynamically stable.   Delay start of Pharmacological VTE agent (>24hrs) due to surgical blood loss or risk of bleeding: no

## 2012-02-27 NOTE — Op Note (Signed)
Paula Rojas, Paula Rojas NO.:  [id number]  MEDICAL RECORD NO.:  [id number]  LOCATION:  [facility]                          [facility]ILITY[facility]  [facility]  PHYSICIAN:  Maxie Better, M.D.DATE OF BIRTH:  31-Jan-1981  DATE OF PROCEDURE:  02/27/2012 DATE OF DISCHARGE:                              OPERATIVE REPORT   PREOPERATIVE DIAGNOSES:  Nonreassuring fetal tracing, previous cesarean section, class A1 gestational diabetes.  POSTOPERATIVE DIAGNOSES:  Nonreassuring fetal tracing, placental abruption, class A1 gestational diabetes, previous cesarean section.  PROCEDURE:  Repeat cesarean section, Kerr hysterotomy.  ANESTHESIA:  Epidural.  SURGEON:  Maxie Better, MD  ASSISTANT:  None.  INDICATION:  This is a 31 year old gravida 2, para 1-0-0-1 married black female at 38-6/7th weeks gestation with a previous low-transverse cesarean section attempting vaginal delivery, who presented in early labor and had spontaneous rupture of membranes, clear fluid at the hospital.  On presentation, the placenta was 2 cm, 80% effaced, -2 station.  Rupture of membranes.  Group B strep was positive.  She was started on penicillin per protocol.  On my arrival to the hospital, the patient had just gotten her epidural anesthesia.  She was complaining of vaginal pressure.  Her tracing was notable for variable decelerations, it was unclear the frequency of contraction pattern.  The patient was examined and she was felt to be 5 cm, 100%, -1 station.  Due to the tracing and internal scalp electrode was placed, an intrauterine pressure catheter was placed with the intention of an amnioinfusion, which was started.  The same time, the patient subsequently had fetal heart rate down into the 70s.  She was placed on changed maternal position, scalp stimulation was done, the amnioinfusion was supposed to be starting.  With the intrauterine pressure catheter, it was noted that the contraction was at least 2  minutes apart to that was present on the monitor.  The nurse at that point reported that the patient was contracting 2-4 minutes prior to show this.  Given the continued fetal heart rate deceleration, subcutaneous terbutaline was ordered and was given operating room was called for emergency cesarean section. Anesthesiologist was subsequently arrived and asked regarding the recent bleed blood pressure on the patient and ephedrine was ordered.  The patient progressed to 9 cm on repeat examination, but was still at -1 station.  The OR had been notified about this cesarean section.  The patient who had been given the subcutaneous terbutaline, was taken to the operating room.  In the operating room, she subsequently had fetal heart rate that was hyper, was 188.  The examination quickly showed that she was still 9 cm.  The patient was prepped for cesarean section.  PROCEDURE:  Under adequate epidural anesthesia, the patient was quickly prepped and draped in usual fashion.  Indwelling Foley catheter was sterilely placed. A Pfannenstiel skin incision was  made, carried down to the rectus fascia.  Rectus fascia was opened transversely.  Rectus fascia was bluntly and sharply dissected off the rectus muscle in superior and inferior fashion.  The rectus muscle was split in midline.  The parietal peritoneum was entered; however, it was noted there was omental adhesions to the lower portion  of the parietal peritoneum and bladder peritoneum overlying and superior to the bladder area.  Due to the urgency of the cesarean section, bladder retractor was then placed.  The omental adhesion was displaced.  The lower uterine segment had no evidence of being thin/separating or evidence of placental abruption.  The vesicouterine peritoneum was opened transversely.  The bladder was then bluntly dissected off the lower uterine segment. Curvilinear low-transverse uterine incision was then made and bloody amniotic  fluid was Noted.  The uterine incision was extended with bandage scissors.  Subsequent delivery of a live female from the occiput anterior position was accomplished.  There was no cord around the neck.  The baby was bulb suctioned.  The cord was clamped, cut.  The baby was transferred to the awaiting pediatrician who ultimately assigned Apgars of 6 and 9 at 1 and 5 minutes.  Cord pH was obtained, which was subsequently noted to be 6.98.  The placenta was manually removed. Uterine cavity was cleaned of debris.  Uterine incision had no extension, it was closed in 2 layers, the first layer was 0 Monocryl running locked stitch, second layer was imbricating using 0 Monocryl suture.  Bleeding at corner of the incision on the left resulted in two figure-of-eight sutures placed with hemostasis noted along with cauterization at that site.  The abdomen was then copiously irrigated and suctioned.  The paracolic gutters were cleaned of debris.  The tubes and ovaries bilaterally were noted to be normal.  The omental adhesions was then carefully lysed.  After good hemostasis was noted, Interceed was placed in a reverse T-fashion overlying the incision of the uterus.  The parietal peritoneum was then closed with 2-0 Vicryl.  The rectus fascia was closed with 0 Vicryl x2. The subcutaneous area was irrigated and small bleeders were cauterized, interrupted 2-0 plain suture was placed and the skin approximated with Ethicon staples.  SPECIMEN:  Placenta sent to pathology.  ESTIMATED BLOOD LOSS:  500 mL.  INTRAOPERATIVE FLUID:  1 L.  URINE OUTPUT:  75 mL.  Sponge and instrument counts x2 were correct.  COMPLICATION:  None.  The patient was transferred to the recovery room in stable condition. The baby was transferred to the regular nursery.     Maxie Better, M.D.     [place]/MEDQ  D:  02/27/2012  T:  02/27/2012  Job:  [id number]

## 2012-02-27 NOTE — Progress Notes (Signed)
Patient ID: Tahj Lindseth, female   DOB: 1981/02/04, 31 y.o.   MRN: [id number]  POSTOPERATIVE DAY # 0 S/P cesarean   S:         Reports feeling ok             Tolerating po intake without nausea or vomiting             Bleeding is light             Newborn breast feeding  / Circumcision planned  O:  A & O x 3              VS: Blood pressure 97/64, pulse 57, temperature 98.3 F (36.8 C), temperature source Oral, resp. rate 18, height 5' (1.524 m), weight 108.41 kg (239 lb), SpO2 96.00%, unknown if currently breastfeeding.  Lungs: Clear and unlabored  Heart: regular rate and rhythm / no mumurs  Abdomen: soft, non-tender, + BS             Fundus: firm, non-tender, Ueven             Dressing intact - no drainge             Lochia: small  Extremities: no edema / SCD in place  A:        POD # 0 S/P cesarean section             P:        Routine postoperative care              Advance per postoperative orders     Marlinda Mike CNM,MSN 02/27/2012, 10:48 AM

## 2012-02-27 NOTE — Addendum Note (Signed)
Addendum  created 02/27/12 0920 by Suella Grove, CRNA   Modules edited:Notes Section

## 2012-02-27 NOTE — Plan of Care (Signed)
Problem: Phase I Progression Outcomes Goal: Voiding adequately Outcome: Not Met (add Reason) Foley catheter

## 2012-02-27 NOTE — Anesthesia Postprocedure Evaluation (Signed)
  Anesthesia Post-op Note  Patient: Paula Rojas  Procedure(s) Performed: Procedure(s) (LRB): CESAREAN SECTION (N/A)  Patient Location: PACU and Mother/Baby  Anesthesia Type: Epidural  Level of Consciousness: awake  Airway and Oxygen Therapy: Patient Spontanous Breathing  Post-op Pain: none  Post-op Assessment: Patient's Cardiovascular Status Stable, Respiratory Function Stable, RESPIRATORY FUNCTION UNSTABLE, No signs of Nausea or vomiting, Adequate PO intake, Pain level controlled, No headache, No backache, No residual numbness and No residual motor weakness  Post-op Vital Signs: Reviewed and stable  Complications: No apparent anesthesia complications

## 2012-02-28 LAB — CBC
MCV: 78.6 fL (ref 78.0–100.0)
Platelets: 176 10*3/uL (ref 150–400)
RDW: 16.1 % — ABNORMAL HIGH (ref 11.5–15.5)
WBC: 9.7 10*3/uL (ref 4.0–10.5)

## 2012-02-28 NOTE — Progress Notes (Signed)
Patient ID: Paula Rojas, female   DOB: 13-Sep-1980, 31 y.o.   MRN: [id number]  POSTOPERATIVE DAY # 1 S/P cesarean section   S:         Reports feeling well - minimal pain and stiffness             Tolerating po intake / no  nausea / no  vomiting / + flatus / no BM             Bleeding is spotting             Pain controlled with motrin and percocet             Up ad lib / ambulatory  Newborn breast feeding  / Circumcision done   O:  A & O x 3 NAD             VS: Blood pressure 104/66, pulse 68, temperature 98.3 F (36.8 C), temperature source Oral, resp. rate 18, height 5' (1.524 m), weight 108.41 kg (239 lb), SpO2 98.00%, unknown if currently breastfeeding.  LABS: WBC/Hgb/Hct/Plts:  9.7/10.8/32.4/176 (06/22 0500)   I&O:   + 1079  Lungs: Clear and unlabored  Heart: regular rate and rhythm / no mumurs  Abdomen: soft, non-tender, non-distended, active BS             Fundus: firm, non-tender, U-1             Dressing intact              Perineum: no edema  Lochia: light  Extremities: trace edema, no calf pain or tenderness, negative Homans  A:        POD # 1 S/P cesarean section            Stable staus  P:        Routine postoperative care              Discontinue IV and dressing / ok shower today             Consider DC in [place] P[place] CNM,MSN 02/28/2012, 8:32 AM

## 2012-02-28 NOTE — Anesthesia Postprocedure Evaluation (Signed)
  Anesthesia Post-op Note  Patient: Paula Rojas  Procedure(s) Performed: Procedure(s) (LRB): CESAREAN SECTION (N/A)  Patient Location: Mother/Baby  Anesthesia Type: Epidural  Level of Consciousness: awake, alert  and oriented  Airway and Oxygen Therapy: Patient Spontanous Breathing  Post-op Pain: none  Post-op Assessment: Post-op Vital signs reviewed, Patient's Cardiovascular Status Stable, No headache, No backache, No residual numbness and No residual motor weakness  Post-op Vital Signs: Reviewed and stable  Complications: No apparent anesthesia complications

## 2012-02-28 NOTE — Addendum Note (Signed)
Addendum  created 02/28/12 0841 by Christene Lye, CRNA   Modules edited:Charges VN, Notes Section

## 2012-02-29 ENCOUNTER — Encounter (HOSPITAL_COMMUNITY)
Admission: RE | Admit: 2012-02-29 | Discharge: 2012-02-29 | Disposition: A | Discharge status: Home / Self Care | Payer: BC Managed Care – PPO | Source: Ambulatory Visit | Attending: Obstetrics and Gynecology | Admitting: Obstetrics and Gynecology

## 2012-02-29 ENCOUNTER — Encounter (HOSPITAL_COMMUNITY): Payer: Self-pay

## 2012-02-29 DIAGNOSIS — O923 Agalactia: Secondary | ICD-10-CM | POA: Insufficient documentation

## 2012-02-29 MED ORDER — OXYCODONE-ACETAMINOPHEN 5-325 MG PO TABS: 1.0000 | ORAL_TABLET | ORAL | Status: AC | PRN

## 2012-02-29 MED ORDER — IBUPROFEN 600 MG PO TABS: 600.0000 mg | ORAL_TABLET | Freq: Four times a day (QID) | ORAL | Status: AC

## 2012-02-29 NOTE — Discharge Summary (Signed)
POSTOPERATIVE DISCHARGE SUMMARY:  Patient ID: Paula Rojas MRN: [id number] DOB/AGE: [birth year] 32 y.o.  Admit date: 02/26/2012 Discharge date:  02/29/2012  Admission Diagnoses: 38 weeks onset of labor / latent labor Previous CS - desires TOLAC GDM-A1  Discharge Diagnoses:   Term Pregnancy-delivered  POD 2 cesarean section (abruption)  GDM-A1 delivered  Placental abruption  Prenatal history: G2P2002   EDC : 03/06/2012, by Other [facility]  P[facility] care at [facility]  Primary provider : Cousins Prenatal course complicated by previous CS / obesity / GDM-A1 / + GBS  Prenatal Labs: ABO, Rh: O (04/11 0000) positive Antibody: Negative (04/11 0000) Rubella: Immune (04/11 0000)  RPR: NON REACTIVE (06/20 2223)  HBsAg: Negative (04/11 0000)  HIV: Non-reactive (04/11 0000)  GBS: Positive (12/07 0000)  1 hr Glucola :ABN   Medical / Surgical History :  Past medical history:  Past Medical History  Diagnosis Date  . Gestational diabetes     Past surgical history:  Past Surgical History  Procedure Date  . Cesarean section     Family History: History reviewed. No pertinent family history.  Social History:  reports that she has never smoked. She has never used smokeless tobacco. She reports that she does not drink alcohol or use illicit drugs.   Allergies: Review of patient's allergies indicates no known allergies.    Current Medications at time of admission:  Prior to Admission medications   Medication Sig Start Date End Date Taking? Authorizing Provider  Prenatal Vit-Fe Fumarate-FA (MULTIVITAMIN-PRENATAL) 27-0.8 MG TABS Take 1 tablet by mouth daily.   Yes Historical Provider, MD    Intrapartum Course:  Admit in latent labor with SROM - previous CS for TOLAC PCN protocol for + GBS Epidural for pain management FHR decels - amnioinfusion/ maternal repositioning ineffective C/S for non-reassuring FHR   Procedures: Cesarean section delivery of female newborn  by Dr Wonda Olds  See operative report for further details  Postoperative / postpartum course: uneventful / discharged home POD 2  Physical Exam:   VSS: Blood pressure 104/71, pulse 70, temperature 97.9 F (36.6 C), temperature source Oral, resp. rate 19, height 5' (1.524 m), weight 108.41 kg (239 lb), SpO2 98.00%, unknown if currently breastfeeding.  LABS:   preop hgb 13.2 / postop hgb 10.8 General: NAD / calm Heart:RR Lungs:clear Abdomen:active BS / non-distended / soft / fundus firm and non-tender Extremities: no evidence DVT / mild dependent edema   Incision:  approximated with staples / no erythema / no ecchymosis /  nodrainage Staples: intact for removal after POD 5 at [facility]  Discharge Instructions:  Discharged Condition: stable Activity: pelvic rest and postoperative restrictions x 2 weeks Diet: routine Medications: see below Medication List  As of 02/29/2012  8:30 AM   TAKE these medications         ibuprofen 600 MG tablet   Commonly known as: ADVIL,MOTRIN   Take 1 tablet (600 mg total) by mouth every 6 (six) hours.      multivitamin-prenatal 27-0.8 MG Tabs   Take 1 tablet by mouth daily.      oxyCODONE-acetaminophen 5-325 MG per tablet   Commonly known as: PERCOCET   Take 1-2 tablets by mouth every 3 (three) hours as needed (moderate - severe pain).           Condition: stable Postpartum Instructions: refer to practice specific booklet Discharge to: home Disposition:  Follow up :  Follow-up Information    Follow up with Paula Rojas,Paula A, MD. Schedule an appointment  as soon as possible for Rojas visit in 6 weeks. (staple removal Tues or Wed - call for apt / sugar test at 6-12 weeks postpartum)    Contact information:   [address] [place] [address] [phone]        Sugar test - 6 to 12 weeks postpartum   Signed: [name] SNM 02/29/2012, 8:30 AM

## 2012-02-29 NOTE — Progress Notes (Signed)
Patient ID: Paula Rojas, female   DOB: Feb 13, 1981, 31 y.o.   MRN: [id number]   POSTOPERATIVE DAY # 2 S/P cesarean section   S:         Reports feeling well - wants d/c today             Tolerating po intake / no nausea / no vomiting / + flatus / no BM             Bleeding is spotting             Pain controlled with motrin and percocet             Up ad lib / ambulatory  Newborn breast feeding & pumping     O:  A & O x 3              VS: Blood pressure 104/71, pulse 70, temperature 97.9 F (36.6 C), temperature source Oral, resp. rate 19, height 5' (1.524 m), weight 108.41 kg (239 lb), SpO2 98.00%, unknown if currently breastfeeding.  Lungs: Clear and unlabored  Heart: regular rate and rhythm  Abdomen: soft, non-tender, non-distended, active BS             Fundus: firm, non-tender, U-1             Dressing OFF              Incision:  approximated with staples / no erythema / no ecchymosis / nodrainage  Perineum: no edema  Lochia: light  Extremities:  1+ edema, no calf pain or tenderness, negative Homans  A:        POD # 2 S/P cesarean section             P:        Routine postoperative care              Early discharge today             Staple removal at [facility] this week - call for apt             [facility] instructions / booklet given     Marlinda Mike SNM 02/29/2012, 8:26 AM

## 2012-03-01 ENCOUNTER — Encounter (HOSPITAL_COMMUNITY): Payer: Self-pay | Admitting: Obstetrics and Gynecology

## 2012-03-31 ENCOUNTER — Encounter (HOSPITAL_COMMUNITY)
Admission: RE | Admit: 2012-03-31 | Discharge: 2012-03-31 | Disposition: A | Discharge status: Home / Self Care | Payer: BC Managed Care – PPO | Source: Ambulatory Visit | Attending: Obstetrics and Gynecology | Admitting: Obstetrics and Gynecology

## 2012-03-31 DIAGNOSIS — O923 Agalactia: Secondary | ICD-10-CM | POA: Insufficient documentation

## 2012-05-01 ENCOUNTER — Encounter (HOSPITAL_COMMUNITY)
Admission: RE | Admit: 2012-05-01 | Discharge: 2012-05-01 | Disposition: A | Discharge status: Home / Self Care | Payer: BC Managed Care – PPO | Source: Ambulatory Visit | Attending: Obstetrics and Gynecology | Admitting: Obstetrics and Gynecology

## 2012-05-01 DIAGNOSIS — O923 Agalactia: Secondary | ICD-10-CM | POA: Insufficient documentation

## 2012-06-01 ENCOUNTER — Encounter (HOSPITAL_COMMUNITY)
Admission: RE | Admit: 2012-06-01 | Discharge: 2012-06-01 | Disposition: A | Discharge status: Home / Self Care | Payer: BC Managed Care – PPO | Source: Ambulatory Visit | Attending: Obstetrics and Gynecology | Admitting: Obstetrics and Gynecology

## 2012-06-01 DIAGNOSIS — O923 Agalactia: Secondary | ICD-10-CM | POA: Insufficient documentation

## 2012-07-02 ENCOUNTER — Encounter (HOSPITAL_COMMUNITY)
Admission: RE | Admit: 2012-07-02 | Discharge: 2012-07-02 | Disposition: A | Discharge status: Home / Self Care | Payer: BC Managed Care – PPO | Source: Ambulatory Visit | Attending: Obstetrics and Gynecology | Admitting: Obstetrics and Gynecology

## 2012-07-02 DIAGNOSIS — O923 Agalactia: Secondary | ICD-10-CM | POA: Insufficient documentation

## 2012-08-02 ENCOUNTER — Encounter (HOSPITAL_COMMUNITY)
Admission: RE | Admit: 2012-08-02 | Discharge: 2012-08-02 | Disposition: A | Discharge status: Home / Self Care | Payer: BC Managed Care – PPO | Source: Ambulatory Visit | Attending: Obstetrics and Gynecology | Admitting: Obstetrics and Gynecology

## 2012-08-02 DIAGNOSIS — O923 Agalactia: Secondary | ICD-10-CM | POA: Insufficient documentation

## 2012-09-01 ENCOUNTER — Encounter (HOSPITAL_COMMUNITY)
Admission: RE | Admit: 2012-09-01 | Discharge: 2012-09-01 | Disposition: A | Discharge status: Home / Self Care | Payer: BC Managed Care – PPO | Source: Ambulatory Visit | Attending: Obstetrics and Gynecology | Admitting: Obstetrics and Gynecology

## 2012-09-01 DIAGNOSIS — O923 Agalactia: Secondary | ICD-10-CM | POA: Insufficient documentation

## 2012-10-02 ENCOUNTER — Encounter (HOSPITAL_COMMUNITY)
Admission: RE | Admit: 2012-10-02 | Discharge: 2012-10-02 | Disposition: A | Discharge status: Home / Self Care | Payer: BC Managed Care – PPO | Source: Ambulatory Visit | Attending: Obstetrics and Gynecology | Admitting: Obstetrics and Gynecology

## 2012-10-02 DIAGNOSIS — O923 Agalactia: Secondary | ICD-10-CM | POA: Insufficient documentation

## 2012-11-02 ENCOUNTER — Encounter (HOSPITAL_COMMUNITY)
Admission: RE | Admit: 2012-11-02 | Discharge: 2012-11-02 | Disposition: A | Discharge status: Home / Self Care | Payer: BC Managed Care – PPO | Source: Ambulatory Visit | Attending: Obstetrics and Gynecology | Admitting: Obstetrics and Gynecology

## 2012-11-02 DIAGNOSIS — O923 Agalactia: Secondary | ICD-10-CM | POA: Insufficient documentation

## 2012-12-01 ENCOUNTER — Encounter (HOSPITAL_COMMUNITY)
Admission: RE | Admit: 2012-12-01 | Discharge: 2012-12-01 | Disposition: A | Discharge status: Home / Self Care | Payer: BC Managed Care – PPO | Source: Ambulatory Visit | Attending: Obstetrics and Gynecology | Admitting: Obstetrics and Gynecology

## 2012-12-01 DIAGNOSIS — O923 Agalactia: Secondary | ICD-10-CM | POA: Insufficient documentation

## 2013-01-01 ENCOUNTER — Encounter (HOSPITAL_COMMUNITY)
Admission: RE | Admit: 2013-01-01 | Discharge: 2013-01-01 | Disposition: A | Discharge status: Home / Self Care | Payer: BC Managed Care – PPO | Source: Ambulatory Visit | Attending: Obstetrics and Gynecology | Admitting: Obstetrics and Gynecology

## 2013-01-01 DIAGNOSIS — O923 Agalactia: Secondary | ICD-10-CM | POA: Insufficient documentation

## 2013-02-01 ENCOUNTER — Encounter (HOSPITAL_COMMUNITY)
Admission: RE | Admit: 2013-02-01 | Discharge: 2013-02-01 | Disposition: A | Discharge status: Home / Self Care | Payer: BC Managed Care – PPO | Source: Ambulatory Visit | Attending: Obstetrics and Gynecology | Admitting: Obstetrics and Gynecology

## 2013-02-01 DIAGNOSIS — O923 Agalactia: Secondary | ICD-10-CM | POA: Insufficient documentation

## 2013-11-07 ENCOUNTER — Encounter (HOSPITAL_BASED_OUTPATIENT_CLINIC_OR_DEPARTMENT_OTHER): Payer: Self-pay | Admitting: Emergency Medicine

## 2013-11-07 ENCOUNTER — Emergency Department (HOSPITAL_BASED_OUTPATIENT_CLINIC_OR_DEPARTMENT_OTHER)
Admission: EM | Admit: 2013-11-07 | Discharge: 2013-11-07 | Disposition: A | Discharge status: Home / Self Care | Payer: BC Managed Care – PPO | Attending: Emergency Medicine | Admitting: Emergency Medicine

## 2013-11-07 DIAGNOSIS — M5430 Sciatica, unspecified side: Secondary | ICD-10-CM | POA: Insufficient documentation

## 2013-11-07 DIAGNOSIS — Z3202 Encounter for pregnancy test, result negative: Secondary | ICD-10-CM | POA: Insufficient documentation

## 2013-11-07 LAB — URINALYSIS, ROUTINE W REFLEX MICROSCOPIC
Bilirubin Urine: NEGATIVE
GLUCOSE, UA: NEGATIVE mg/dL
HGB URINE DIPSTICK: NEGATIVE
Ketones, ur: NEGATIVE mg/dL
Leukocytes, UA: NEGATIVE
Nitrite: NEGATIVE
PH: 5.5 (ref 5.0–8.0)
PROTEIN: NEGATIVE mg/dL
Specific Gravity, Urine: 1.018 (ref 1.005–1.030)
Urobilinogen, UA: 0.2 mg/dL (ref 0.0–1.0)

## 2013-11-07 LAB — PREGNANCY, URINE: PREG TEST UR: NEGATIVE

## 2013-11-07 MED ORDER — CYCLOBENZAPRINE HCL 5 MG PO TABS: 5.0000 mg | ORAL_TABLET | Freq: Two times a day (BID) | ORAL | Status: DC | PRN

## 2013-11-07 MED ORDER — HYDROCODONE-ACETAMINOPHEN 5-325 MG PO TABS: 1.0000 | ORAL_TABLET | ORAL | Status: DC | PRN

## 2013-11-07 NOTE — Discharge Instructions (Signed)
Back Pain, Adult  Back pain is very common. The pain often gets better over time. The cause of back pain is usually not dangerous. Most people can learn to manage their back pain on their own.   HOME CARE   · Stay active. Start with short walks on flat ground if you can. Try to walk farther each day.  · Do not sit, drive, or stand in one place for more than 30 minutes. Do not stay in bed.  · Do not avoid exercise or work. Activity can help your back heal faster.  · Be careful when you bend or lift an object. Bend at your knees, keep the object close to you, and do not twist.  · Sleep on a firm mattress. Lie on your side, and bend your knees. If you lie on your back, put a pillow under your knees.  · Only take medicines as told by your doctor.  · Put ice on the injured area.  · Put ice in a plastic bag.  · Place a towel between your skin and the bag.  · Leave the ice on for 15-20 minutes, 03-04 times a day for the first 2 to 3 days. After that, you can switch between ice and heat packs.  · Ask your doctor about back exercises or massage.  · Avoid feeling anxious or stressed. Find good ways to deal with stress, such as exercise.  GET HELP RIGHT AWAY IF:   · Your pain does not go away with rest or medicine.  · Your pain does not go away in 1 week.  · You have new problems.  · You do not feel well.  · The pain spreads into your legs.  · You cannot control when you poop (bowel movement) or pee (urinate).  · Your arms or legs feel weak or lose feeling (numbness).  · You feel sick to your stomach (nauseous) or throw up (vomit).  · You have belly (abdominal) pain.  · You feel like you may pass out (faint).  MAKE SURE YOU:   · Understand these instructions.  · Will watch your condition.  · Will get help right away if you are not doing well or get worse.  Document Released: 02/11/2008 Document Revised: 11/17/2011 Document Reviewed: 01/13/2011  [employer]® Patient Information ©2014 [employer].

## 2013-11-07 NOTE — ED Provider Notes (Signed)
Medical screening examination/treatment/procedure(s) were performed by non-physician practitioner and as supervising physician I was immediately available for consultation/collaboration.   EKG Interpretation None        Makaylen Thieme, MD 11/07/13 1506 

## 2013-11-07 NOTE — ED Notes (Signed)
Complaining of lower back pain without known injury. Pt states pain shooting down right leg as well.

## 2013-11-07 NOTE — ED Notes (Signed)
Pt denies any dysuria, frequency or urgency, reports right sided low back pain x yesterday when she was stretching yesterday morning. Pt states the pain starts in her right low back/buttock and "shoots down my right leg.Marland Kitchen.Marland Kitchen."

## 2013-11-07 NOTE — ED Provider Notes (Signed)
CSN: [id number]     Arrival date & time 11/07/13  1020 History   First MD Initiated Contact with Patient 11/07/13 1039     Chief Complaint  Patient presents with  . Back Pain     (Consider location/radiation/quality/duration/timing/severity/associated sxs/prior Treatment) HPI Comments: Low back, started 3 days ago. No injury. Hx of sciatica and feels similar  Patient is a 33 y.o. female presenting with back pain. The history is provided by the patient. No language interpreter was used.  Back Pain Location:  Lumbar spine Quality:  Aching Radiates to:  R posterior upper leg Pain severity:  Moderate Pain is:  Same all the time Onset quality:  Gradual Timing:  Constant Progression:  Unchanged Chronicity:  New Relieved by:  Nothing Worsened by:  Movement Ineffective treatments:  OTC medications Associated symptoms: no numbness and no weakness     History reviewed. No pertinent past medical history. Past Surgical History  Procedure Laterality Date  . Cesarean section    . Cesarean section  02/27/2012    Procedure: CESAREAN SECTION;  Surgeon: Serita KyleSheronette A Cousins, MD;  Location: [facility];  Service: Gynecology;  Laterality: N/A;   No family history on file. History  Substance Use Topics  . Smoking status: Never Smoker   . Smokeless tobacco: Never Used  . Alcohol Use: No   OB History   Grav Para Term Preterm Abortions TAB SAB Ect Mult Living   2 2 2       2      Review of Systems  Constitutional: Negative.   Respiratory: Negative.   Cardiovascular: Negative.   Musculoskeletal: Positive for back pain.  Neurological: Negative for weakness and numbness.      Allergies  Review of patient's allergies indicates no known allergies.  Home Medications   Current Outpatient Rx  Name  Route  Sig  Dispense  Refill  . Prenatal Vit-Fe Fumarate-FA (MULTIVITAMIN-PRENATAL) 27-0.8 MG TABS   Oral   Take 1 tablet by mouth daily.          BP 140/89  Pulse 102  Temp(Src) 98.4 F  (36.9 C) (Oral)  Resp 16  SpO2 100%  LMP 11/02/2013 Physical Exam  Nursing note and vitals reviewed. Constitutional: She is oriented to person, place, and time. She appears well-developed and well-nourished.  Cardiovascular: Normal rate and regular rhythm.   Pulmonary/Chest: Effort normal and breath sounds normal.  Abdominal: Soft. Bowel sounds are normal.  Musculoskeletal: Normal range of motion.  Right sciatic notch tenderness. Moves all extremities without any problem  Neurological: She is alert and oriented to person, place, and time. She exhibits normal muscle tone. Coordination normal.  Skin: Skin is warm and dry.  Psychiatric: She has a normal mood and affect.    ED Course  Procedures (including critical care time) Labs Review Labs Reviewed  URINALYSIS, ROUTINE W REFLEX MICROSCOPIC  PREGNANCY, URINE   Imaging Review No results found.   EKG Interpretation None      MDM   Final diagnoses:  Sciatica   Pt is nt having any neuro deficits. Will treat with vicodin and flexeril    Teressa LowerVrinda Amedee Cerrone, NP 11/07/13 1145

## 2014-07-10 ENCOUNTER — Encounter (HOSPITAL_BASED_OUTPATIENT_CLINIC_OR_DEPARTMENT_OTHER): Payer: Self-pay | Admitting: Emergency Medicine

## 2015-04-17 ENCOUNTER — Encounter (HOSPITAL_BASED_OUTPATIENT_CLINIC_OR_DEPARTMENT_OTHER): Payer: Self-pay | Admitting: Emergency Medicine

## 2015-04-17 ENCOUNTER — Emergency Department (HOSPITAL_BASED_OUTPATIENT_CLINIC_OR_DEPARTMENT_OTHER)
Admission: EM | Admit: 2015-04-17 | Discharge: 2015-04-17 | Disposition: A | Discharge status: Home / Self Care | Payer: BLUE CROSS/BLUE SHIELD | Attending: Physician Assistant | Admitting: Physician Assistant

## 2015-04-17 DIAGNOSIS — M5441 Lumbago with sciatica, right side: Secondary | ICD-10-CM | POA: Diagnosis not present

## 2015-04-17 DIAGNOSIS — Z79899 Other long term (current) drug therapy: Secondary | ICD-10-CM | POA: Insufficient documentation

## 2015-04-17 DIAGNOSIS — M545 Low back pain: Secondary | ICD-10-CM | POA: Diagnosis present

## 2015-04-17 MED ORDER — OXYCODONE-ACETAMINOPHEN 5-325 MG PO TABS
1.0000 | ORAL_TABLET | Freq: Once | ORAL | Status: AC
  Administered 2015-04-17: 1 via ORAL
  Filled 2015-04-17: qty 1

## 2015-04-17 MED ORDER — OXYCODONE-ACETAMINOPHEN 5-325 MG PO TABS: 1.0000 | ORAL_TABLET | Freq: Four times a day (QID) | ORAL | Status: DC | PRN

## 2015-04-17 NOTE — ED Notes (Signed)
Pt c/o lower back pain, started last night when she was getting groceries out of the car. Pain radiates down right leg. No numbness or tingling in the leg. Patient is having difficulty walking and sitting.

## 2015-04-17 NOTE — Discharge Instructions (Signed)
If you're breast-feeding, please do not use Percocet and breast-feed. You will have to wait several hours after taking the Percocet in order for that not to be in the breast milk. Please talk about your regular provider about this. Otherwise you Back Exercises Back exercises help treat and prevent back injuries. The goal is to increase your strength in your belly (abdominal) and back muscles. These exercises can also help with flexibility. Start these exercises when told by your doctor. HOME CARE Back exercises include: Pelvic Tilt.  Lie on your back with your knees bent. Tilt your pelvis until the lower part of your back is against the floor. Hold this position 5 to 10 sec. Repeat this exercise 5 to 10 times. Knee to Chest.  Pull 1 knee up against your chest and hold for 20 to 30 seconds. Repeat this with the other knee. This may be done with the other leg straight or bent, whichever feels better. Then, pull both knees up against your chest. Sit-Ups or Curl-Ups.  Bend your knees 90 degrees. Start with tilting your pelvis, and do a partial, slow sit-up. Only lift your upper half 30 to 45 degrees off the floor. Take at least 2 to 3 seonds for each sit-up. Do not do sit-ups with your knees out straight. If partial sit-ups are difficult, simply do the above but with only tightening your belly (abdominal) muscles and holding it as told. Hip-Lift.  Lie on your back with your knees flexed 90 degrees. Push down with your feet and shoulders as you raise your hips 2 inches off the floor. Hold for 10 seconds, repeat 5 to 10 times. Back Arches.  Lie on your stomach. Prop yourself up on bent elbows. Slowly press on your hands, causing an arch in your low back. Repeat 3 to 5 times. Shoulder-Lifts.  Lie face down with arms beside your body. Keep hips and belly pressed to floor as you slowly lift your head and shoulders off the floor. Do not overdo your exercises. Be careful in the beginning. Exercises may  cause you some mild back discomfort. If the pain lasts for more than 15 minutes, stop the exercises until you see your doctor. Improvement with exercise for back problems is slow.  Document Released: 09/27/2010 Document Revised: 11/17/2011 Document Reviewed: 06/26/2011 [facility] Patient Information 2015 [place] [address], [place]. This information is not intended to replace advice given to you by your health care provider. Make sure you discuss any questions you have with your health care provider.  may use Tylenol.

## 2015-04-17 NOTE — ED Provider Notes (Signed)
CSN: [id number]     Arrival date & time 04/17/15  1107 History   First MD Initiated Contact with Patient 04/17/15 1132     Chief Complaint  Patient presents with  . Back Pain     (Consider location/radiation/quality/duration/timing/severity/associated sxs/prior Treatment) Patient is a 34 y.o. female presenting with back pain.  Back Pain Associated symptoms: no abdominal pain and no chest pain     Patient he is a 34 year old female otherwise healthy. Patient presents with pain radiating from her back down her right lower extremitye. She's got no weakness no numbness or tingling. Normal strength. Patient had this happen several times the past. It usually resolves in a couple days with a work note and Percocet.no fevers no bowel or bladder incontinence.  History reviewed. No pertinent past medical history. Past Surgical History  Procedure Laterality Date  . Cesarean section    . Cesarean section  02/27/2012    Procedure: CESAREAN SECTION;  Surgeon: Serita Kyle, MD;  Location: [facility];  Service: Gynecology;  Laterality: N/A;   No family history on file. History  Substance Use Topics  . Smoking status: Never Smoker   . Smokeless tobacco: Never Used  . Alcohol Use: No   OB History    Gravida Para Term Preterm AB TAB SAB Ectopic Multiple Living   2 2 2       2      Review of Systems  Constitutional: Negative for activity change.  Respiratory: Negative for shortness of breath.   Cardiovascular: Negative for chest pain.  Gastrointestinal: Negative for abdominal pain.  Musculoskeletal: Positive for back pain.      Allergies  Review of patient's allergies indicates no known allergies.  Home Medications   Prior to Admission medications   Medication Sig Start Date End Date Taking? Authorizing Provider  norethindrone-ethinyl estradiol-iron (MICROGESTIN FE,GILDESS FE,LOESTRIN FE) 1.5-30 MG-MCG tablet Take 1 tablet by mouth daily.   Yes Historical Provider, MD   cyclobenzaprine (FLEXERIL) 5 MG tablet Take 1 tablet (5 mg total) by mouth 2 (two) times daily as needed for muscle spasms. 11/07/13   Teressa Lower, NP  HYDROcodone-acetaminophen (NORCO/VICODIN) 5-325 MG per tablet Take 1-2 tablets by mouth every 4 (four) hours as needed. 11/07/13   Teressa Lower, NP  Prenatal Vit-Fe Fumarate-FA (MULTIVITAMIN-PRENATAL) 27-0.8 MG TABS Take 1 tablet by mouth daily.    Historical Provider, MD   BP 126/79 mmHg  Pulse 77  Temp(Src) 98.1 F (36.7 C)  Resp 18  Ht 5' (1.524 m)  Wt 231 lb (104.781 kg)  BMI 45.11 kg/m2  SpO2 100%  LMP 03/20/2015 (Approximate) Physical Exam  Constitutional: She is oriented to person, place, and time. She appears well-developed and well-nourished.  HENT:  Head: Normocephalic and atraumatic.  Eyes: Conjunctivae are normal. Right eye exhibits no discharge.  Neck: Neck supple.  Cardiovascular: Normal rate.   Pulmonary/Chest: Effort normal. No respiratory distress.  Musculoskeletal: Normal range of motion. She exhibits no edema.  Normal strength and sensation to right lower extremity  no saddle anesthesia.  Neurological: She is oriented to person, place, and time. No cranial nerve deficit.  Skin: Skin is warm and dry. No rash noted. She is not diaphoretic.  Psychiatric: She has a normal mood and affect. Her behavior is normal.  Nursing note and vitals reviewed.   ED Course  Procedures (including critical care time) Labs Review Labs Reviewed - No data to display  Imaging Review No results found.   EKG Interpretation None  MDM   Final diagnoses:  None   Patient is a 34 year old female presenting with back pain. Patient had no trauma. Patient likely has sciatica. She's had it several times in the past. We will give her Percocet and a work note. Patient has no red flags.no numbness tingling bowel or bladder incontinence. No fever. normalphysical exam.We will have her follow-up with her primary care  provider.   Savio Albrecht Randall An, MD 04/17/15 1144

## 2019-05-17 ENCOUNTER — Other Ambulatory Visit: Payer: Self-pay | Admitting: Obstetrics and Gynecology

## 2019-05-17 DIAGNOSIS — N6320 Unspecified lump in the left breast, unspecified quadrant: Secondary | ICD-10-CM

## 2019-05-25 ENCOUNTER — Ambulatory Visit
Admission: RE | Admit: 2019-05-25 | Discharge: 2019-05-25 | Disposition: A | Discharge status: Home / Self Care | Payer: BC Managed Care – PPO | Source: Ambulatory Visit | Attending: Obstetrics and Gynecology | Admitting: Obstetrics and Gynecology

## 2019-05-25 ENCOUNTER — Other Ambulatory Visit: Payer: Self-pay

## 2019-05-25 DIAGNOSIS — N6320 Unspecified lump in the left breast, unspecified quadrant: Secondary | ICD-10-CM

## 2021-03-19 IMAGING — US US BREAST*L* LIMITED INC AXILLA
1 series · 9 of 9 positions shown · non-contrast
Comparison: Previous exam(s).

CLINICAL DATA: Screening recall for a possible left breast mass.

EXAM:
ULTRASOUND OF THE LEFT BREAST

[Series 1: us breast*left* limited inc axilla · 0.06mm/px · 9 of 9 slices shown]
[im 1/9]
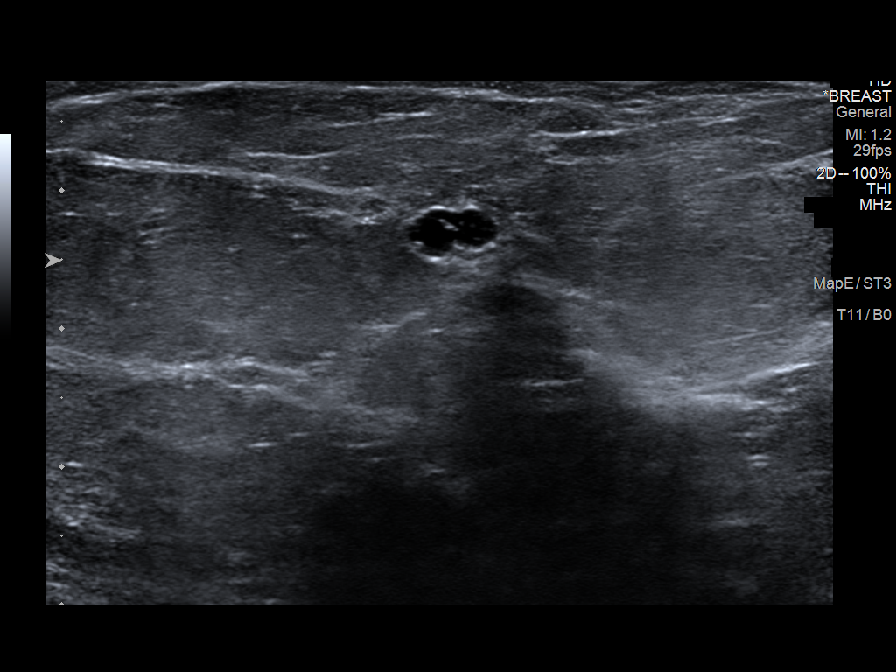
[im 2/9]
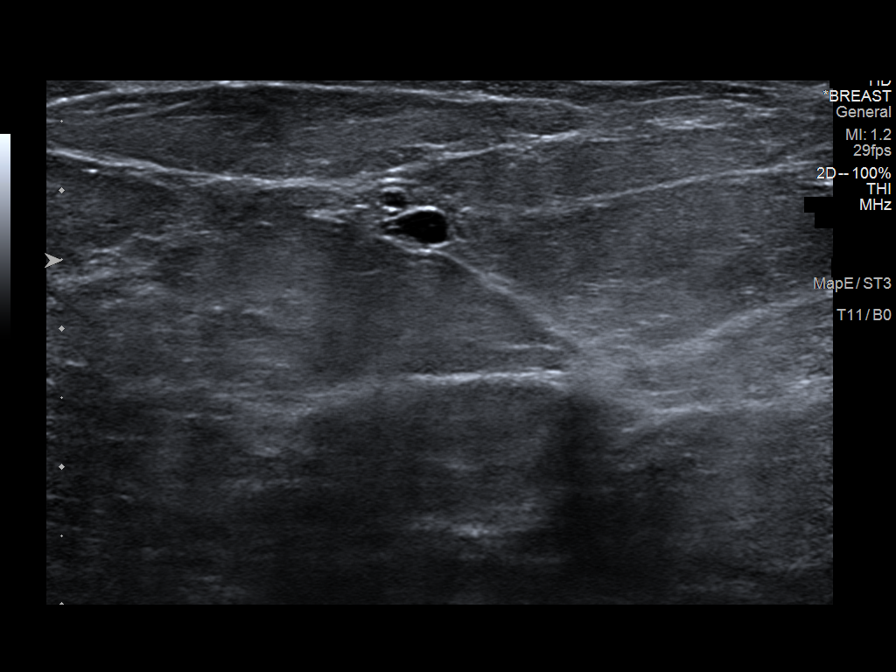
[im 3/9]
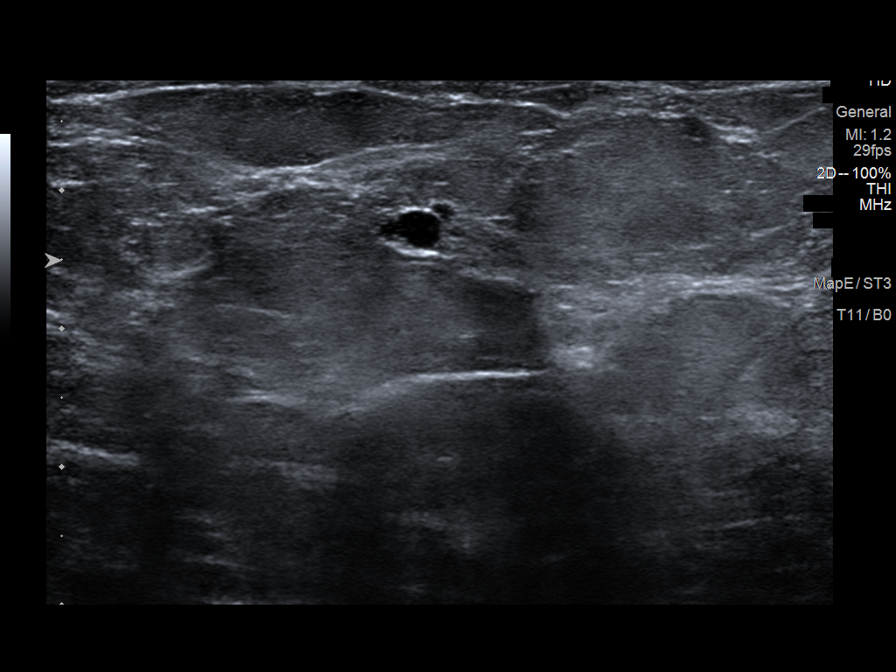
[im 4/9]
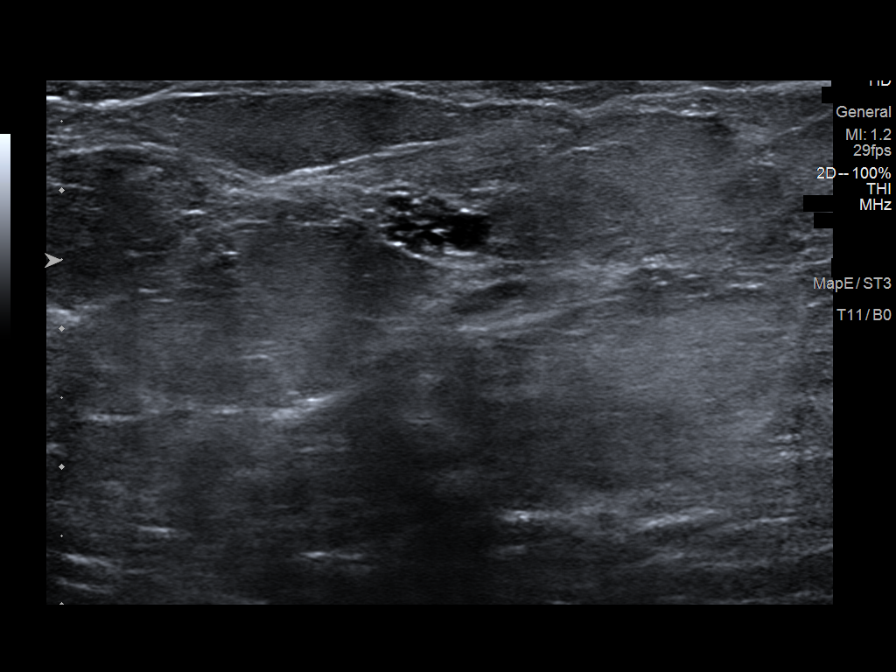
[im 5/9]
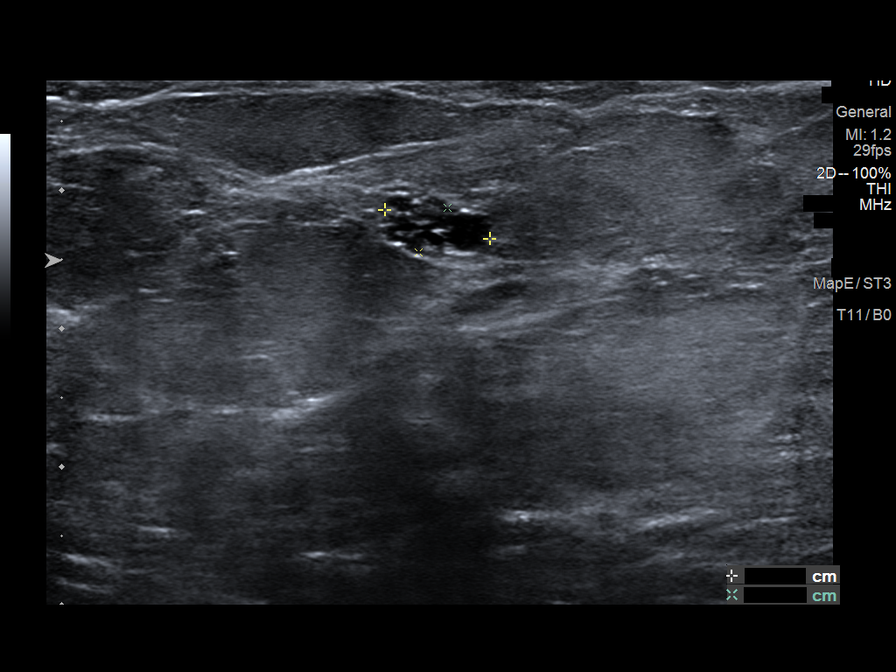
[im 6/9]
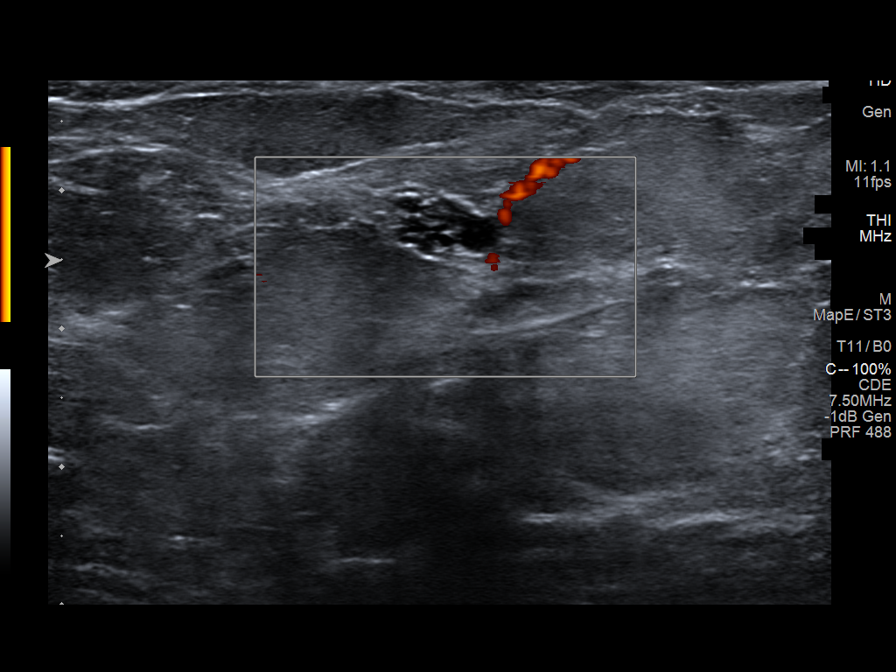
[im 7/9]
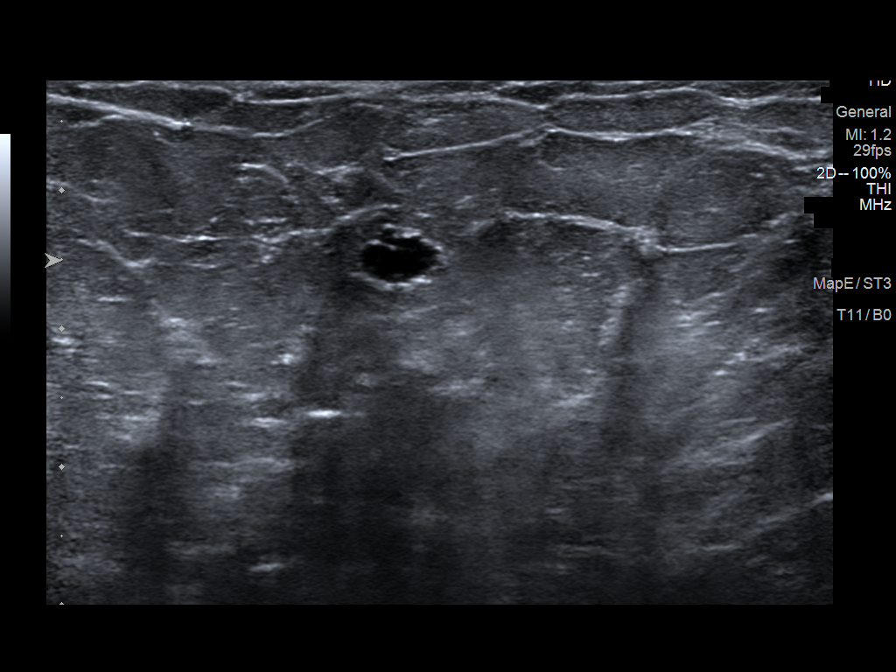
[im 8/9]
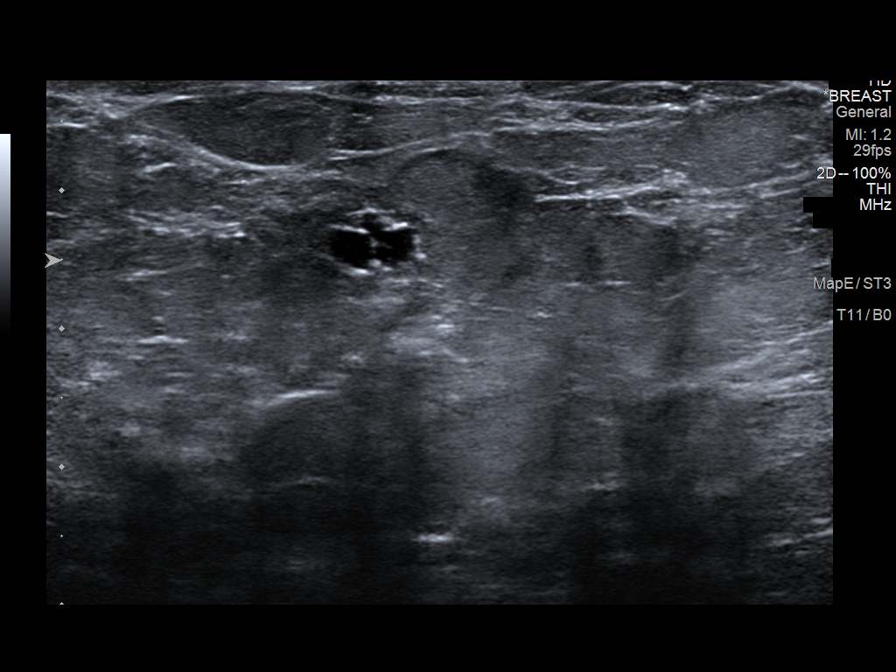
[im 9/9]
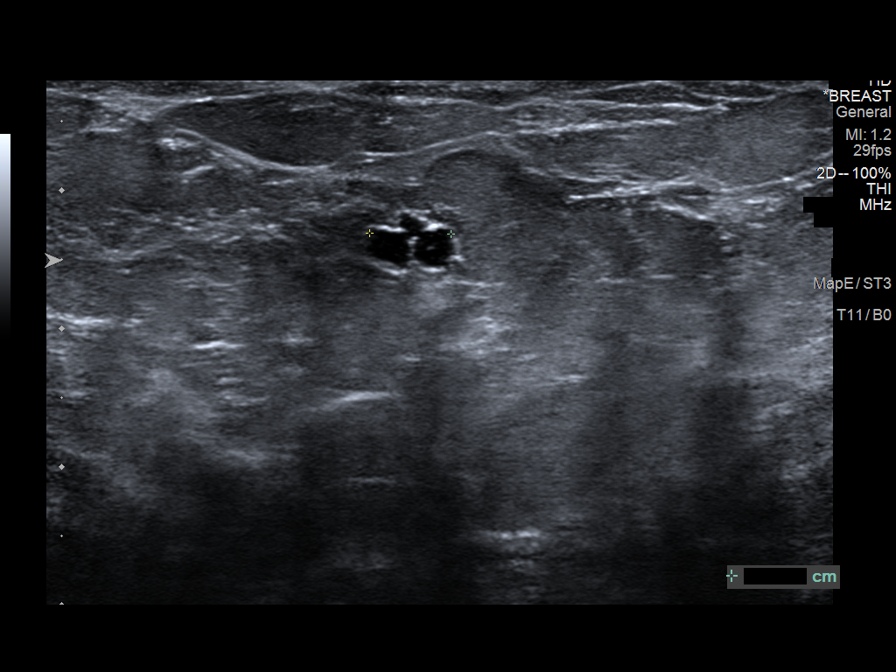

[9 of 9 positions shown; findings below may reference images not displayed]

FINDINGS: Ultrasound of the left breast at 10 o'clock, 10 cm from the nipple
demonstrates a cluster of anechoic cysts all together measuring 8 x
4 x 6 mm.
IMPRESSION: The left breast mass at 10 o'clock corresponds with a benign cluster
of cysts.

RECOMMENDATION:
Screening mammogram in one year.(Code:XV-G-H0Y)

I have discussed the findings and recommendations with the patient.
If applicable, a reminder letter will be sent to the patient
regarding the next appointment.

BI-RADS CATEGORY  2: Benign.

## 2021-07-29 LAB — HM PAP SMEAR

## 2022-07-14 ENCOUNTER — Encounter: Payer: Self-pay | Admitting: Nurse Practitioner

## 2022-07-14 ENCOUNTER — Ambulatory Visit (INDEPENDENT_AMBULATORY_CARE_PROVIDER_SITE_OTHER): Payer: BC Managed Care – PPO | Admitting: Nurse Practitioner

## 2022-07-14 ENCOUNTER — Ambulatory Visit (INDEPENDENT_AMBULATORY_CARE_PROVIDER_SITE_OTHER): Payer: BC Managed Care – PPO

## 2022-07-14 VITALS — BP 130/80 | HR 80 | Temp 97.3°F | Ht 60.0 in | Wt 251.2 lb

## 2022-07-14 DIAGNOSIS — Z8739 Personal history of other diseases of the musculoskeletal system and connective tissue: Secondary | ICD-10-CM

## 2022-07-14 DIAGNOSIS — K5909 Other constipation: Secondary | ICD-10-CM | POA: Diagnosis not present

## 2022-07-14 DIAGNOSIS — G8929 Other chronic pain: Secondary | ICD-10-CM

## 2022-07-14 DIAGNOSIS — N912 Amenorrhea, unspecified: Secondary | ICD-10-CM | POA: Diagnosis not present

## 2022-07-14 DIAGNOSIS — M5442 Lumbago with sciatica, left side: Secondary | ICD-10-CM | POA: Diagnosis not present

## 2022-07-14 DIAGNOSIS — M5441 Lumbago with sciatica, right side: Secondary | ICD-10-CM

## 2022-07-14 DIAGNOSIS — Z8632 Personal history of gestational diabetes: Secondary | ICD-10-CM | POA: Insufficient documentation

## 2022-07-14 LAB — POCT URINE PREGNANCY: Preg Test, Ur: NEGATIVE

## 2022-07-14 MED ORDER — IBUPROFEN 800 MG PO TABS: 800.0000 mg | ORAL_TABLET | Freq: Every day | ORAL | 0 refills | Status: DC | PRN

## 2022-07-14 NOTE — Assessment & Plan Note (Signed)
Reports hx of irregular menstrual cycle, sexually active, no contraception. Under the care of GYN: Dr. Garwin Brothers. LMP 06/03/2022

## 2022-07-14 NOTE — Progress Notes (Signed)
Established Patient Visit  Patient: Paula Rojas   DOB: [birth year]   41 y.o. Female  MRN: [id number] Visit Date: 07/14/2022  Subjective:    Chief Complaint  Patient presents with   Establish Care    New Pt, Est Care  Joint pain since 2017, lower back pain and radiates down her legs on both sides Requesting records for Pap   HPI Amenorrhea Reports hx of irregular menstrual cycle, sexually active, no contraception. Under the care of GYN: Dr. Cherly Hensen. LMP 06/03/2022  Constipation, chronic BM every 3-4days Use of coffee with significant improvement  Chronic bilateral low back pain with bilateral sciatica Onset 2017, not associated to any injury, no previous surgery. Previous eval by [facility]: cosrtisone injection administered in 2017 with moderate relief. She was referred to outpatient PT but did not complete sessions. Today she reports worsen back pain with bilateral radiculopathy: pain radiates down both thighs. Pain is worse with prolonged sitting. No change in GI/GU function, no saddle paresthesia, no weakness, no change in gait. Current use of ibuprofen 800mg  with significant relief  Advised to avoid prolonged sitting (>3hrs), start home stretching exercise, continue use of ibuprofen as needed for pain. Repeat lumbar spine x-ray due to hx of herniated disc. Ref to PT if no acute finding on x-ray  Reviewed medical, surgical, and social history today  Medications: Outpatient Medications Prior to Visit  Medication Sig   [DISCONTINUED] ibuprofen (ADVIL) 600 MG tablet Take 600 mg by mouth every 6 (six) hours as needed.   [DISCONTINUED] cyclobenzaprine (FLEXERIL) 5 MG tablet Take 1 tablet (5 mg total) by mouth 2 (two) times daily as needed for muscle spasms.   [DISCONTINUED] HYDROcodone-acetaminophen (NORCO/VICODIN) 5-325 MG per tablet Take 1-2 tablets by mouth every 4 (four) hours as needed.   [DISCONTINUED] norethindrone-ethinyl estradiol-iron (MICROGESTIN  FE,GILDESS FE,LOESTRIN FE) 1.5-30 MG-MCG tablet Take 1 tablet by mouth daily.   [DISCONTINUED] oxyCODONE-acetaminophen (PERCOCET/ROXICET) 5-325 MG per tablet Take 1 tablet by mouth every 6 (six) hours as needed for severe pain.   [DISCONTINUED] Prenatal Vit-Fe Fumarate-FA (MULTIVITAMIN-PRENATAL) 27-0.8 MG TABS Take 1 tablet by mouth daily.   No facility-administered medications prior to visit.   Reviewed past medical and social history.   ROS per HPI above      Objective:  BP 130/80 (BP Location: Right Arm, Patient Position: Sitting, Cuff Size: Normal)   Pulse 80   Temp (!) 97.3 F (36.3 C) (Temporal)   Ht 5' (1.524 m)   Wt 251 lb 3.2 oz (113.9 kg)   LMP 06/06/2022 (Exact Date)   SpO2 97%   Breastfeeding No   BMI 49.06 kg/m      Physical Exam Vitals reviewed.  Cardiovascular:     Rate and Rhythm: Normal rate and regular rhythm.     Pulses: Normal pulses.     Heart sounds: Normal heart sounds.  Pulmonary:     Effort: Pulmonary effort is normal.     Breath sounds: Normal breath sounds.  Abdominal:     General: There is no distension.     Palpations: Abdomen is soft.     Tenderness: There is no abdominal tenderness. There is no right CVA tenderness or left CVA tenderness.  Musculoskeletal:     Thoracic back: Normal.     Lumbar back: Normal.     Right hip: Normal.     Left hip: Normal.     Right upper leg: Normal.  Left upper leg: Normal.  Neurological:     Mental Status: She is alert and oriented to person, place, and time.     Results for orders placed or performed in visit on 07/14/22  POCT urine pregnancy  Result Value Ref Range   Preg Test, Ur Negative Negative      Assessment & Plan:    Problem List Items Addressed This Visit       Digestive   Constipation, chronic    BM every 3-4days Use of coffee with significant improvement        Nervous and Auditory   Chronic bilateral low back pain with bilateral sciatica - Primary    Onset 2017, not  associated to any injury, no previous surgery. Previous eval by [facility]: cosrtisone injection administered in 2017 with moderate relief. She was referred to outpatient PT but did not complete sessions. Today she reports worsen back pain with bilateral radiculopathy: pain radiates down both thighs. Pain is worse with prolonged sitting. No change in GI/GU function, no saddle paresthesia, no weakness, no change in gait. Current use of ibuprofen 800mg  with significant relief  Advised to avoid prolonged sitting (>3hrs), start home stretching exercise, continue use of ibuprofen as needed for pain. Repeat lumbar spine x-ray due to hx of herniated disc. Ref to PT if no acute finding on x-ray       Relevant Medications   ibuprofen (ADVIL) 800 MG tablet   Other Relevant Orders   DG Lumbar Spine Complete     Other   Amenorrhea    Reports hx of irregular menstrual cycle, sexually active, no contraception. Under the care of GYN: Dr. Garwin Brothers. LMP 06/03/2022      Relevant Orders   POCT urine pregnancy (Completed)   History of herniated intervertebral disc   Relevant Medications   ibuprofen (ADVIL) 800 MG tablet   Other Relevant Orders   DG Lumbar Spine Complete   Return in about 2 months (around 09/13/2022) for CPE (fasting).     Wilfred Lacy, NP

## 2022-07-14 NOTE — Assessment & Plan Note (Signed)
Onset 2017, not associated to any injury, no previous surgery. Previous eval by [facility]: cosrtisone injection administered in 2017 with moderate relief. She was referred to outpatient PT but did not complete sessions. Today she reports worsen back pain with bilateral radiculopathy: pain radiates down both thighs. Pain is worse with prolonged sitting. No change in GI/GU function, no saddle paresthesia, no weakness, no change in gait. Current use of ibuprofen 800mg  with significant relief  Advised to avoid prolonged sitting (>3hrs), start home stretching exercise, continue use of ibuprofen as needed for pain. Repeat lumbar spine x-ray due to hx of herniated disc. Ref to PT if no acute finding on x-ray

## 2022-07-14 NOTE — Progress Notes (Unsigned)
Initial visit for lower back pain with bilateral sciatica. Hx of herniated intervertebral disc Denies injury

## 2022-07-14 NOTE — Patient Instructions (Signed)
Thank you for choosing [facility]  Go to lab for blood draw  Sign medical release form to get your records from GYN.  Avoid prolonged sitting (>3hrs), start home stretching exercise, continue use of ibuprofen as needed for pain.  Back Exercises These exercises help to make your trunk and back strong. They also help to keep the lower back flexible. Doing these exercises can help to prevent or lessen pain in your lower back. If you have back pain, try to do these exercises 2-3 times each day or as told by your doctor. As you get better, do the exercises once each day. Repeat the exercises more often as told by your doctor. To stop back pain from coming back, do the exercises once each day, or as told by your doctor. Do exercises exactly as told by your doctor. Stop right away if you feel sudden pain or your pain gets worse. Exercises Single knee to chest Do these steps 3-5 times in a row for each leg: Lie on your back on a firm bed or the floor with your legs stretched out. Bring one knee to your chest. Grab your knee or thigh with both hands and hold it in place. Pull on your knee until you feel a gentle stretch in your lower back or butt. Keep doing the stretch for 10-30 seconds. Slowly let go of your leg and straighten it. Pelvic tilt Do these steps 5-10 times in a row: Lie on your back on a firm bed or the floor with your legs stretched out. Bend your knees so they point up to the ceiling. Your feet should be flat on the floor. Tighten your lower belly (abdomen) muscles to press your lower back against the floor. This will make your tailbone point up to the ceiling instead of pointing down to your feet or the floor. Stay in this position for 5-10 seconds while you gently tighten your muscles and breathe evenly. Cat-cow Do these steps until your lower back bends more easily: Get on your hands and knees on a firm bed or the floor. Keep your hands under your shoulders, and  keep your knees under your hips. You may put padding under your knees. Let your head hang down toward your chest. Tighten (contract) the muscles in your belly. Point your tailbone toward the floor so your lower back becomes rounded like the back of a cat. Stay in this position for 5 seconds. Slowly lift your head. Let the muscles of your belly relax. Point your tailbone up toward the ceiling so your back forms a sagging arch like the back of a cow. Stay in this position for 5 seconds.  Press-ups Do these steps 5-10 times in a row: Lie on your belly (face-down) on a firm bed or the floor. Place your hands near your head, about shoulder-width apart. While you keep your back relaxed and keep your hips on the floor, slowly straighten your arms to raise the top half of your body and lift your shoulders. Do not use your back muscles. You may change where you place your hands to make yourself more comfortable. Stay in this position for 5 seconds. Keep your back relaxed. Slowly return to lying flat on the floor.  Bridges Do these steps 10 times in a row: Lie on your back on a firm bed or the floor. Bend your knees so they point up to the ceiling. Your feet should be flat on the floor. Your arms should be flat  at your sides, next to your body. Tighten your butt muscles and lift your butt off the floor until your waist is almost as high as your knees. If you do not feel the muscles working in your butt and the back of your thighs, slide your feet 1-2 inches (2.5-5 cm) farther away from your butt. Stay in this position for 3-5 seconds. Slowly lower your butt to the floor, and let your butt muscles relax. If this exercise is too easy, try doing it with your arms crossed over your chest. Belly crunches Do these steps 5-10 times in a row: Lie on your back on a firm bed or the floor with your legs stretched out. Bend your knees so they point up to the ceiling. Your feet should be flat on the floor. Cross  your arms over your chest. Tip your chin a little bit toward your chest, but do not bend your neck. Tighten your belly muscles and slowly raise your chest just enough to lift your shoulder blades a tiny bit off the floor. Avoid raising your body higher than that because it can put too much stress on your lower back. Slowly lower your chest and your head to the floor. Back lifts Do these steps 5-10 times in a row: Lie on your belly (face-down) with your arms at your sides, and rest your forehead on the floor. Tighten the muscles in your legs and your butt. Slowly lift your chest off the floor while you keep your hips on the floor. Keep the back of your head in line with the curve in your back. Look at the floor while you do this. Stay in this position for 3-5 seconds. Slowly lower your chest and your face to the floor. Contact a doctor if: Your back pain gets a lot worse when you do an exercise. Your back pain does not get better within 2 hours after you exercise. If you have any of these problems, stop doing the exercises. Do not do them again unless your doctor says it is okay. Get help right away if: You have sudden, very bad back pain. If this happens, stop doing the exercises. Do not do them again unless your doctor says it is okay. This information is not intended to replace advice given to you by your health care provider. Make sure you discuss any questions you have with your health care provider. Document Revised: 11/07/2020 Document Reviewed: 11/07/2020 Elsevier Patient Education  2023 [employer].

## 2022-07-14 NOTE — Assessment & Plan Note (Addendum)
BM every 3-4days Use of coffee with significant improvement

## 2022-07-17 NOTE — Addendum Note (Signed)
Addended by: Alysia Penna L on: 07/17/2022 02:47 PM   Modules accepted: Orders

## 2022-07-22 ENCOUNTER — Ambulatory Visit: Payer: BC Managed Care – PPO | Attending: Nurse Practitioner | Admitting: Physical Therapy

## 2022-07-22 DIAGNOSIS — G8929 Other chronic pain: Secondary | ICD-10-CM | POA: Diagnosis present

## 2022-07-22 DIAGNOSIS — R252 Cramp and spasm: Secondary | ICD-10-CM | POA: Insufficient documentation

## 2022-07-22 DIAGNOSIS — M5441 Lumbago with sciatica, right side: Secondary | ICD-10-CM | POA: Insufficient documentation

## 2022-07-22 DIAGNOSIS — M5442 Lumbago with sciatica, left side: Secondary | ICD-10-CM | POA: Diagnosis not present

## 2022-07-22 NOTE — Therapy (Signed)
OUTPATIENT PHYSICAL THERAPY THORACOLUMBAR EVALUATION   Patient Name: Paula Rojas MRN: [id number] DOB:Jul 08, 1981, 41 y.o., female Today's Date: 07/22/2022   PT End of Session - 07/22/22 1320     Visit Number 1    Number of Visits 12    Date for PT Re-Evaluation 09/02/22    Authorization Type [employer]    PT Start Time 9233    PT Stop Time 1402    PT Time Calculation (min) 45 min    Activity Tolerance Patient tolerated treatment well    Behavior During Therapy [facility] for tasks assessed/performed             No past medical history on file. Past Surgical History:  Procedure Laterality Date   CESAREAN SECTION     CESAREAN SECTION  02/27/2012   Procedure: CESAREAN SECTION;  Surgeon: Marvene Staff, MD;  Location: [facility];  Service: Gynecology;  Laterality: N/A;   Patient Active Problem List   Diagnosis Date Noted   History of gestational diabetes 07/14/2022   Chronic bilateral low back pain with bilateral sciatica 07/14/2022   History of herniated intervertebral disc 07/14/2022   Amenorrhea 07/14/2022   Constipation, chronic 07/14/2022   Gestational diabetes mellitus 12/02/2011    PCP: Flossie Buffy, NP  REFERRING PROVIDER: Flossie Buffy, NP  REFERRING DIAG: M54.42,M54.41,G89.29 (ICD-10-CM) - Chronic bilateral low back pain with bilateral sciatica   Rationale for Evaluation and Treatment: Rehabilitation  THERAPY DIAG:  Chronic right-sided low back pain with right-sided sciatica  Cramp and spasm  ONSET DATE: 07/13/2022  SUBJECTIVE:                                                                                                                                                                                           SUBJECTIVE STATEMENT: Patient reports that her pain started in 2017, thinks she moved wrong and the pain started, felt fine after 3 days, in 2017 noticed a little back pain, but then going down stairs suddenly couldn't move.  Got a little PT at  the time, but felt fine after given cortisone shot which completely relieved pain, so she's been fine since.  A little pain on and off but nothing that really bothered her.  Current episode of pain started Nov 5, woke up and had excrutiating pain again, been interferring with ADLs.   Used to walk while sons rode bikes but driveway is uphill so hurt too much.   PERTINENT HISTORY:  C-section x 2, previous episode low back pain and herniated disc 2017  PAIN:  Are you having pain? Yes: NPRS scale: 3/10 Pain location: R side low back,  occasional tingling down leg, big toe hurts (feels jammed) Pain description: dull ache Aggravating factors: standing, moving, walking more than short distances Relieving factors: sitting, leaning back, laying down  PRECAUTIONS: None  WEIGHT BEARING RESTRICTIONS: No  FALLS:  Has patient fallen in last 6 months? 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   NEXT MD VISIT: 09/15/2022  OBJECTIVE:   DIAGNOSTIC FINDINGS:  07/14/2022 DG Lumbar spine IMPRESSION: Degenerative disc disease at L5-S1. No evidence for acute abnormality.  PATIENT SURVEYS:  Modified Oswestry 13/50= 26% - moderate disability    SCREENING FOR RED FLAGS: Bowel or bladder incontinence: No Spinal tumors: No Cauda equina syndrome: No Compression fracture: No Abdominal aneurysm: No  COGNITION: Overall cognitive status: Within functional limits for tasks assessed     SENSATION: Light touch: WFL  MUSCLE LENGTH: Hamstrings: Right  90 deg;  Left 90 deg Prone knee bend test: Right 90 deg; Left 90 deg  POSTURE: increased lumbar lordosis  PALPATION: Tenderness L5/SI, bil SIJ, bil glut med and piriformis (R>>L)  LUMBAR ROM:   AROM eval  Flexion To toes  Extension WNL, pain R side low back   Right lateral flexion To knees pulling  Left lateral flexion To knees, pulling  Right rotation WNL  Left rotation WNL   (Blank rows = not tested)  LOWER EXTREMITY ROM:   good hip mobility bil, no pain  LOWER EXTREMITY MMT:    MMT Right eval Left eval  Hip flexion 5 5  Hip extension 4+ 5  Hip abduction 5 5  Hip adduction 5 5  Knee flexion 5 5  Knee extension 4+ 4+  Ankle dorsiflexion 5 5  Ankle plantarflexion     (Blank rows = not tested)  LUMBAR SPECIAL TESTS:  Straight leg raise test: Negative, FABER test: Negative, and Long sit test: Positive  for R rotation innominate, negative retest  FUNCTIONAL TESTS:  5 times sit to stand: 10 seconds without UE assist.    GAIT: Distance walked: 50' Assistive device utilized: None Level of assistance: Complete Independence Comments: no deviation or device   TODAY'S TREATMENT:                                                                                                                              DATE:   07/22/2022 Therapeutic Exercise: to improve strength and mobility.  Demo, verbal and tactile cues throughout for technique. MET with resisted R hip flexion 5 x 5 sec isometric holds, L hip extension 5  x 5 sec holds, followed by hip abduction 5 x 5 sec hold, and adduction 5 x 5 sec hold to set.   Prone knee bends x 10 bil Prone hip extension x 10 bil  Supine PPT x 10    PATIENT EDUCATION:  Education details: findings, POC, education on dry needling, initial HEP Person educated: Patient Education method: Explanation, Demonstration, Verbal cues, and Handouts Education comprehension: verbalized understanding and returned demonstration  HOME EXERCISE PROGRAM: Access  Code: [email or web address] URL: [email or web address] Date: 07/22/2022 Prepared by: Glenetta Hew  Exercises - Supine Posterior Pelvic Tilt  - 1 x daily - 7 x weekly - 2 sets - 10 reps - Lying Prone  - 1 x daily - 7 x weekly - 3 min  hold - Prone Knee Flexion  - 1 x daily - 7 x weekly - 2 sets - 10 reps - Prone Hip Extension with Pillow Under Abdomen  - 1 x daily - 7 x weekly - 2 sets - 10 reps  ASSESSMENT:  CLINICAL IMPRESSION: Patient is a 41y.o. female who was seen today for physical therapy evaluation and treatment for chronic low back pain.  Davis Vannatter reports primarily right-sided low back pain with radicular symptoms into her right glutes, also her right great toe.  Today noted she has increased ligament laxity throughout.  She has very good range of motion and can almost place hands on the floor, no hamstring tightness but significant quad tightness.  She had positive supine to long sit test for rotation of right innominate, negative retest after muscle energy technique.  She demonstrates tenderness in L5/S1 and bilateral SI joints.  Discussed the importance of core strengthening considering her ligament laxity and history of C-sections x2.  She would benefit from skilled physical therapy to decrease pain, improve quality of life, improve core strength, and improve ability to participate in ADLs and exercise.  We also discussed the importance of taking regular breaks at work.  OBJECTIVE IMPAIRMENTS: decreased activity tolerance, decreased mobility, decreased strength, increased fascial restrictions, impaired perceived functional ability, increased muscle spasms, and pain.   ACTIVITY LIMITATIONS: carrying, lifting, bending, standing, sleeping, stairs, and locomotion level  PARTICIPATION LIMITATIONS: meal prep, cleaning, laundry, shopping, community activity, and yard work  PERSONAL FACTORS: 1 comorbidity: Chronic back pain  are also affecting patient's functional outcome.    REHAB POTENTIAL: Excellent  CLINICAL DECISION MAKING: Stable/uncomplicated  EVALUATION COMPLEXITY: Low   GOALS: Goals reviewed with patient? Yes  SHORT TERM GOALS: Target date: 08/05/2022   Patient will be independent with initial HEP.  Baseline: given Goal status: INITIAL   LONG TERM GOALS: Target date: 09/02/2022    Patient will be independent with advanced/ongoing HEP to improve outcomes and carryover.  Baseline: needs progression  Goal status: INITIAL  2.  Patient will report 75% improvement in low back pain to improve QOL.  Baseline:  Goal status: INITIAL  3.  Patient will demonstrate full pain free lumbar ROM to perform ADLs.   Baseline: see objective Goal status: INITIAL  4.  Patient will demonstrate improved functional strength as demonstrated by 5/5 R hip extensor/hamstring strength. Baseline: 4+/5 Goal status: INITIAL  5.  Patient will report 12% improvement on modified Oswestry to demonstrate improved functional ability.  Baseline: 26% Goal status: INITIAL   6.  Patient will tolerate 15 min of brisk walking to start walking with children for exercise.  Baseline: unable Goal status: INITIAL  7.  Patient will be able to perform all desired activities without limitation  from low back pain. Baseline:  Goal status: INITIAL   PLAN:  PT FREQUENCY: 1-2x/week  PT DURATION: 6 weeks  PLANNED INTERVENTIONS: Therapeutic exercises, Therapeutic activity, Neuromuscular re-education, Balance training, Gait training, Patient/Family education, Self Care, Joint mobilization, Stair training, Dry Needling, Electrical stimulation, Spinal mobilization, Cryotherapy, Moist heat, Traction, Ultrasound, Manual therapy, and Re-evaluation.  PLAN FOR NEXT SESSION: review and progress exercises for core strengthening, starting neutral spine, progressing to more extension, manual therapy, dry needling low back, R piriformis/glutes if agreeable, modalities PRN.    Rennie Natter, PT, DPT  07/22/2022, 3:48 PM

## 2022-07-22 NOTE — Patient Instructions (Signed)

## 2022-07-29 ENCOUNTER — Ambulatory Visit: Payer: BC Managed Care – PPO

## 2022-07-29 DIAGNOSIS — G8929 Other chronic pain: Secondary | ICD-10-CM

## 2022-07-29 DIAGNOSIS — R252 Cramp and spasm: Secondary | ICD-10-CM

## 2022-07-29 DIAGNOSIS — M5441 Lumbago with sciatica, right side: Secondary | ICD-10-CM | POA: Diagnosis not present

## 2022-07-29 NOTE — Therapy (Addendum)
OUTPATIENT PHYSICAL THERAPY TREATMENT   Patient Name: Paula Rojas MRN: [id number] DOB:1981/08/30, 41 y.o., female Today's Date: 07/29/2022   PT End of Session - 07/29/22 1451     Visit Number 2    Number of Visits 12    Date for PT Re-Evaluation 09/02/22    Authorization Type BCBS    PT Start Time 1405    PT Stop Time 1445    PT Time Calculation (min) 40 min    Activity Tolerance Patient tolerated treatment well    Behavior During Therapy [facility] for tasks assessed/performed              History reviewed. No pertinent past medical history. Past Surgical History:  Procedure Laterality Date   CESAREAN SECTION     CESAREAN SECTION  02/27/2012   Procedure: CESAREAN SECTION;  Surgeon: Marvene Staff, MD;  Location: [facility];  Service: Gynecology;  Laterality: N/A;   Patient Active Problem List   Diagnosis Date Noted   History of gestational diabetes 07/14/2022   Chronic bilateral low back pain with bilateral sciatica 07/14/2022   History of herniated intervertebral disc 07/14/2022   Amenorrhea 07/14/2022   Constipation, chronic 07/14/2022   Gestational diabetes mellitus 12/02/2011    PCP: Flossie Buffy, NP  REFERRING PROVIDER: Flossie Buffy, NP  REFERRING DIAG: M54.42,M54.41,G89.29 (ICD-10-CM) - Chronic bilateral low back pain with bilateral sciatica   Rationale for Evaluation and Treatment: Rehabilitation  THERAPY DIAG:  Chronic right-sided low back pain with right-sided sciatica  Cramp and spasm  ONSET DATE: 07/13/2022  SUBJECTIVE:                                                                                                                                                                                           SUBJECTIVE STATEMENT: Pt reports she is doing better today, just R hip pain.  PERTINENT HISTORY:  C-section x 2, previous episode low back pain and herniated disc 2017  PAIN:  Are you having pain? Yes: NPRS scale: 3/10 Pain  location: R side low back, occasional tingling down leg, big toe hurts (feels jammed) Pain description: dull ache Aggravating factors: standing, moving, walking more than short distances Relieving factors: sitting, leaning back, laying down  PRECAUTIONS: None  WEIGHT BEARING RESTRICTIONS: No  FALLS:  Has patient fallen in last 6 months? 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   NEXT MD VISIT: 09/15/2022  OBJECTIVE:   DIAGNOSTIC FINDINGS:  07/14/2022 DG Lumbar spine IMPRESSION: Degenerative disc disease at L5-S1. No evidence for acute abnormality.  PATIENT SURVEYS:  Modified Oswestry 13/50= 26% - moderate disability    SCREENING FOR RED FLAGS: Bowel or bladder incontinence: No Spinal tumors: No Cauda equina syndrome: No Compression fracture: No Abdominal aneurysm: No  COGNITION: Overall cognitive status: Within functional limits for tasks assessed     SENSATION: Light touch: WFL  MUSCLE LENGTH: Hamstrings: Right  90 deg; Left 90 deg Prone knee bend test: Right 90 deg; Left 90 deg  POSTURE: increased lumbar lordosis  PALPATION: Tenderness L5/SI, bil SIJ, bil glut med and piriformis (R>>L)  LUMBAR ROM:   AROM eval  Flexion To toes  Extension WNL, pain R side low back   Right lateral flexion To knees pulling  Left lateral flexion To knees, pulling  Right rotation WNL  Left rotation WNL   (Blank rows = not tested)  LOWER EXTREMITY ROM:   good hip mobility bil, no pain  LOWER EXTREMITY MMT:    MMT Right eval  Left eval  Hip flexion 5 5  Hip extension 4+ 5  Hip abduction 5 5  Hip adduction 5 5  Knee flexion 5 5  Knee extension 4+ 4+  Ankle dorsiflexion 5 5  Ankle plantarflexion     (Blank rows = not tested)  LUMBAR SPECIAL TESTS:  Straight leg raise test: Negative, FABER test: Negative, and Long sit test: Positive  for R rotation innominate, negative retest  FUNCTIONAL TESTS:  5 times sit to stand: 10 seconds without UE assist.    GAIT: Distance walked: 50' Assistive device utilized: None Level of assistance: Complete Independence Comments: no deviation or device   TODAY'S TREATMENT:                                                                                                                              DATE:  07/29/2022 Therapeutic Exercise: to improve strength and mobility.  Demo, verbal and tactile cues throughout for technique. Supine PPT x 10 Bridge with TrA brace 2x10 Isometric 90/90 hips 5x10" Prone HS curls 1 set x 10 no weight: 2nd set manual resistance - TB to use at home Prone hip extension x 10 BLE Prone press up x 10 Seated hip hinge 2x10 yellow wt ball  Chest press yellow weight ball 2x10 Standing pallof press blue TB x 10  07/22/2022 Therapeutic Exercise: to improve strength and mobility.  Demo, verbal and tactile cues throughout for technique. MET with resisted R hip flexion 5 x 5 sec isometric holds, L hip extension 5 x 5 sec holds, followed by hip abduction 5 x 5 sec hold, and adduction 5 x   5 sec hold to set.   Prone knee bends x 10 bil Prone hip extension x 10 bil  Supine PPT x 10    PATIENT EDUCATION:  Education details: HEP update Person educated: Patient Education method: Explanation, Demonstration, Verbal cues, and Handouts Education comprehension: verbalized understanding and returned demonstration  HOME EXERCISE PROGRAM: Access Code: [email or web address] URL: [email or web address] Date: 07/29/2022 Prepared by: Braylin  Clark  Exercises - Supine Posterior Pelvic Tilt  - 1 x daily - 7 x weekly - 2 sets - 10 reps - Isometric Dead Bug  - 1 x daily - 7 x weekly - 2 sets - 5 reps - 10 second hold - Prone Press Up  - 1 x daily - 7 x weekly - 2 sets - 10 reps - 5 sec hold - Prone Knee Flexion  - 1 x daily - 7 x weekly - 2 sets - 10 reps - Prone Hip Extension with Pillow Under Abdomen  - 1 x daily - 7 x weekly - 2 sets - 10 reps - Standing Anti-Rotation Press with Anchored Resistance  - 1 x daily - 7 x weekly - 2 sets - 10 reps  ASSESSMENT:  CLINICAL IMPRESSION: Pt responded well to the progression of exercises with no increased pain. Progressed core strengthening to tolerance. In prone required cues to avoid pelvic rotation with hip extension. Able to progress HEP to target more abdominal strengthening and provided instruction as needed. Pt reported pain improvement in R LB from 3/10 to 1.5/10 pos session.  OBJECTIVE IMPAIRMENTS: decreased activity tolerance, decreased mobility, decreased strength, increased fascial restrictions, impaired perceived functional ability, increased muscle spasms, and pain.   ACTIVITY LIMITATIONS: carrying, lifting, bending, standing, sleeping, stairs, and locomotion level  PARTICIPATION LIMITATIONS: meal prep, cleaning, laundry, shopping, community activity, and yard work  PERSONAL FACTORS: 1 comorbidity: Chronic back pain  are also affecting patient's functional outcome.   REHAB POTENTIAL: Excellent  CLINICAL DECISION MAKING: Stable/uncomplicated  EVALUATION COMPLEXITY: Low   GOALS: Goals reviewed with patient? Yes  SHORT TERM GOALS: Target date: 08/05/2022   Patient will be independent with initial HEP.  Baseline: given Goal status: IN PROGRESS   LONG TERM GOALS: Target date: 09/02/2022    Patient will be independent with advanced/ongoing HEP to improve outcomes and carryover.  Baseline: needs progression  Goal status: IN PROGRESS  2.  Patient will report 75%  improvement in low back pain to improve QOL.  Baseline:  Goal status: IN PROGRESS  3.  Patient will demonstrate full pain free lumbar ROM to perform ADLs.   Baseline: see objective Goal status: IN PROGRESS  4.  Patient will demonstrate improved functional strength as demonstrated by 5/5 R hip extensor/hamstring strength. Baseline: 4+/5 Goal status: IN PROGRESS  5.  Patient will report 12% improvement on modified Oswestry to demonstrate improved functional ability.  Baseline: 26% Goal status: IN PROGRESS   6.  Patient will tolerate 15 min of brisk walking to start walking with children for exercise.  Baseline: unable Goal status: IN PROGRESS  7.  Patient will be able to perform all desired activities without limitation from low back pain. Baseline:  Goal status: IN PROGRESS   PLAN:  PT FREQUENCY: 1-2x/week  PT DURATION: 6 weeks  PLANNED INTERVENTIONS: Therapeutic exercises, Therapeutic activity, Neuromuscular re-education, Balance training, Gait training, Patient/Family education, Self Care, Joint mobilization, Stair training, Dry Needling, Electrical stimulation, Spinal mobilization, Cryotherapy, Moist heat, Traction, Ultrasound, Manual therapy, and Re-evaluation.  PLAN FOR NEXT SESSION: progress core strengthening, starting neutral   spine, progressing to more extension, manual therapy, dry needling low back, R piriformis/glutes if agreeable, modalities PRN.    Braylin L Clark, PTA 07/29/2022, 2:53 PM  

## 2022-08-05 ENCOUNTER — Ambulatory Visit: Payer: BC Managed Care – PPO | Admitting: Physical Therapy

## 2022-08-05 ENCOUNTER — Encounter: Payer: Self-pay | Admitting: Physical Therapy

## 2022-08-05 DIAGNOSIS — G8929 Other chronic pain: Secondary | ICD-10-CM

## 2022-08-05 DIAGNOSIS — R252 Cramp and spasm: Secondary | ICD-10-CM

## 2022-08-05 DIAGNOSIS — M5441 Lumbago with sciatica, right side: Secondary | ICD-10-CM | POA: Diagnosis not present

## 2022-08-05 NOTE — Therapy (Signed)
OUTPATIENT PHYSICAL THERAPY TREATMENT   Patient Name: Paula Rojas MRN: [id number] DOB:Jul 28, 1981, 41 y.o., female Today's Date: 08/05/2022   PT End of Session - 08/05/22 1801     Visit Number 3    Number of Visits 12    Date for PT Re-Evaluation 09/02/22    Authorization Type BCBS    PT Start Time 1401    PT Stop Time 1443    PT Time Calculation (min) 42 min    Activity Tolerance Patient tolerated treatment well    Behavior During Therapy [facility] for tasks assessed/performed              History reviewed. No pertinent past medical history. Past Surgical History:  Procedure Laterality Date   CESAREAN SECTION     CESAREAN SECTION  02/27/2012   Procedure: CESAREAN SECTION;  Surgeon: Marvene Staff, MD;  Location: [facility];  Service: Gynecology;  Laterality: N/A;   Patient Active Problem List   Diagnosis Date Noted   History of gestational diabetes 07/14/2022   Chronic bilateral low back pain with bilateral sciatica 07/14/2022   History of herniated intervertebral disc 07/14/2022   Amenorrhea 07/14/2022   Constipation, chronic 07/14/2022   Gestational diabetes mellitus 12/02/2011    PCP: Flossie Buffy, NP  REFERRING PROVIDER: Flossie Buffy, NP  REFERRING DIAG: M54.42,M54.41,G89.29 (ICD-10-CM) - Chronic bilateral low back pain with bilateral sciatica   Rationale for Evaluation and Treatment: Rehabilitation  THERAPY DIAG:  Chronic right-sided low back pain with right-sided sciatica  Cramp and spasm  ONSET DATE: 07/13/2022  SUBJECTIVE:                                                                                                                                                                                           SUBJECTIVE STATEMENT: Paula Rojas reports her pain is much better, still has pain mostly in R hip. Good compliance with HEP.   PERTINENT HISTORY:  C-section x 2, previous episode low back pain and herniated disc 2017  PAIN:  Are  you having pain? Yes: NPRS scale: 2/10 Pain location: R side low back, occasional tingling down leg, big toe hurts (feels jammed) Pain description: dull ache Aggravating factors: standing, moving, walking more than short distances Relieving factors: sitting, leaning back, laying down  PRECAUTIONS: None  WEIGHT BEARING RESTRICTIONS: No  FALLS:  Has patient fallen in last 6 months? 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   NEXT MD VISIT: 09/15/2022  OBJECTIVE:   DIAGNOSTIC FINDINGS:  07/14/2022 DG Lumbar spine IMPRESSION: Degenerative disc disease at L5-S1. No evidence for acute abnormality.  PATIENT SURVEYS:  Modified Oswestry 13/50= 26% - moderate disability    SCREENING FOR RED FLAGS: Bowel or bladder incontinence: No Spinal tumors: No Cauda equina syndrome: No Compression fracture: No Abdominal aneurysm: No  COGNITION: Overall cognitive status: Within functional limits for tasks assessed     SENSATION: Light touch: WFL  MUSCLE LENGTH: Hamstrings: Right  90 deg; Left 90 deg Prone knee bend test: Right 90 deg; Left 90 deg  POSTURE: increased lumbar lordosis  PALPATION: Tenderness L5/SI, bil SIJ, bil glut med and piriformis (R>>L)  LUMBAR ROM:   AROM eval  Flexion To toes  Extension WNL, pain R side low back   Right lateral flexion To knees pulling  Left lateral flexion To knees, pulling  Right rotation WNL  Left rotation WNL   (Blank rows = not tested)  LOWER EXTREMITY ROM:   good hip mobility bil, no  pain  LOWER EXTREMITY MMT:    MMT Right eval Left eval  Hip flexion 5 5  Hip extension 4+ 5  Hip abduction 5 5  Hip adduction 5 5  Knee flexion 5 5  Knee extension 4+ 4+  Ankle dorsiflexion 5 5  Ankle plantarflexion     (Blank rows = not tested)  LUMBAR SPECIAL TESTS:  Straight leg raise test: Negative, FABER test: Negative, and Long sit test: Positive  for R rotation innominate, negative retest  FUNCTIONAL TESTS:  5 times sit to stand: 10 seconds without UE assist.    GAIT: Distance walked: 50' Assistive device utilized: None Level of assistance: Complete Independence Comments: no deviation or device   TODAY'S TREATMENT:                                                                                                                              DATE:  08/05/2022 Therapeutic Exercise: to improve strength and mobility.  Demo, verbal and tactile cues throughout for technique. Bike L2 x 5 min  Supine hip IR/ER active stretch PPT x 10 Bent knee falls outs with TrA x 10 bil  Bridges x 10 Quadruped leg extensions x 10 bil  Bird dogs x 10 bil  Child pose stretch Manual Therapy: to decrease muscle spasm and pain and improve mobility IASTM with foam roller to bil glutes, QL, lumbar paraspinals, STM/TPR to R glut med and piriformis, L glut medius, skilled palpation and monitoring during dry needling. Trigger Point Dry-Needling  Treatment instructions: Expect mild to moderate muscle soreness. S/S of pneumothorax if dry needled over a lung field, and to seek  immediate medical attention should they occur. Patient verbalized understanding of these instructions and education. Patient Consent Given: Yes Education handout provided: Yes Muscles treated: R piriformis, R glut med, L glut med Electrical stimulation performed: No Parameters: N/A Treatment response/outcome: Twitch Response Elicited and Palpable Increase in Muscle Length  07/22/2022 Therapeutic Exercise: to improve  strength and mobility.  Demo, verbal and tactile cues throughout for technique. Supine PPT x 10 Bridge with TrA brace 2x10 Isometric 90/90 hips 5x10" Prone HS curls 1 set x 10 no weight: 2nd set manual resistance - TB to use at home Prone hip extension x 10 BLE Prone press up x 10 Seated hip hinge 2x10 yellow wt ball  Chest press yellow weight ball 2x10 Standing pallof press blue TB x 10  07/22/2022 Therapeutic Exercise: to improve strength and mobility.  Demo, verbal and tactile cues throughout for technique. MET with resisted R hip flexion 5 x 5 sec isometric holds, L hip extension 5 x 5 sec holds, followed by hip abduction 5 x 5 sec hold, and adduction 5 x 5 sec hold to set.   Prone knee bends x 10 bil Prone hip extension x 10 bil  Supine PPT x 10    PATIENT EDUCATION:  Education details: Dry needling Person educated: Patient Education method: Explanation and Handouts Education comprehension: verbalized understanding  HOME EXERCISE PROGRAM: Access Code: [email or web address] URL: [email or web address] Date: 07/29/2022 Prepared by: Clarene Essex  Exercises - Supine Posterior Pelvic Tilt  - 1 x daily - 7 x weekly - 2 sets - 10 reps - Isometric Dead Bug  - 1 x daily - 7 x weekly - 2 sets - 5 reps - 10 second hold - Prone Press Up  - 1 x daily - 7 x weekly - 2 sets - 10 reps - 5 sec hold - Prone Knee Flexion  - 1 x daily - 7 x weekly - 2 sets - 10 reps - Prone Hip Extension with Pillow Under Abdomen  - 1 x daily - 7 x weekly - 2 sets - 10 reps - Standing Anti-Rotation Press with Anchored Resistance  - 1 x daily - 7 x weekly - 2 sets - 10 reps  ASSESSMENT:  CLINICAL IMPRESSION: Paula Rojas is making good progress and compliant with HEP, meeting STG #1.  Today progressed exercises, reported better tolerance of quadruped leg extensions and bird dogs than prone leg extensions.  Noted palpable trigger points in R piriformis and bil glut medius with manual therapy.  After  explanation of DN rational, procedures, outcomes and potential side effects, patient verbalized consent to DN treatment in conjunction with manual STM/DTM and TPR to reduce ttp/muscle tension. Muscles treated as indicated above. DN produced normal response with good twitches elicited resulting in palpable reduction in pain/ttp and muscle tension, with patient noting less pain upon initiation of movement following DN. Pt educated to expect mild to moderate muscle soreness for up to 24-48 hrs and instructed to continue prescribed home exercise program and current activity level with pt verbalizing understanding of theses instructions.  Paula Rojas continues to demonstrate potential for improvement and would benefit from continued skilled therapy to address impairments.     OBJECTIVE IMPAIRMENTS: decreased activity tolerance, decreased mobility, decreased strength, increased fascial restrictions, impaired perceived functional ability, increased muscle spasms, and pain.   ACTIVITY LIMITATIONS: carrying, lifting, bending, standing, sleeping, stairs, and locomotion level  PARTICIPATION LIMITATIONS: meal prep, cleaning, laundry, shopping, community activity, and yard work  PERSONAL FACTORS: 1 comorbidity: Chronic back  pain  are also affecting patient's functional outcome.   REHAB POTENTIAL: Excellent  CLINICAL DECISION MAKING: Stable/uncomplicated  EVALUATION COMPLEXITY: Low   GOALS: Goals reviewed with patient? Yes  SHORT TERM GOALS: Target date: 08/05/2022   Patient will be independent with initial HEP.  Baseline: given Goal status: MET 08/05/22   LONG TERM GOALS: Target date: 09/02/2022    Patient will be independent with advanced/ongoing HEP to improve outcomes and carryover.  Baseline: needs progression  Goal status: IN PROGRESS  2.  Patient will report 75% improvement in low back pain to improve QOL.  Baseline:  Goal status: IN PROGRESS  3.  Patient will demonstrate full pain  free lumbar ROM to perform ADLs.   Baseline: see objective Goal status: IN PROGRESS  4.  Patient will demonstrate improved functional strength as demonstrated by 5/5 R hip extensor/hamstring strength. Baseline: 4+/5 Goal status: IN PROGRESS  5.  Patient will report 12% improvement on modified Oswestry to demonstrate improved functional ability.  Baseline: 26% Goal status: IN PROGRESS   6.  Patient will tolerate 15 min of brisk walking to start walking with children for exercise.  Baseline: unable Goal status: IN PROGRESS  7.  Patient will be able to perform all desired activities without limitation from low back pain. Baseline:  Goal status: IN PROGRESS   PLAN:  PT FREQUENCY: 1-2x/week  PT DURATION: 6 weeks  PLANNED INTERVENTIONS: Therapeutic exercises, Therapeutic activity, Neuromuscular re-education, Balance training, Gait training, Patient/Family education, Self Care, Joint mobilization, Stair training, Dry Needling, Electrical stimulation, Spinal mobilization, Cryotherapy, Moist heat, Traction, Ultrasound, Manual therapy, and Re-evaluation.  PLAN FOR NEXT SESSION: progress core strengthening, starting neutral spine, progressing to more extension, manual therapy, dry needling low back, R piriformis/glutes if agreeable, modalities PRN.    Rennie Natter, PT, DPT  08/05/2022, 6:09 PM

## 2022-08-12 ENCOUNTER — Ambulatory Visit: Payer: BC Managed Care – PPO | Attending: Nurse Practitioner

## 2022-08-12 ENCOUNTER — Telehealth: Payer: Self-pay | Admitting: Nurse Practitioner

## 2022-08-12 DIAGNOSIS — M5441 Lumbago with sciatica, right side: Secondary | ICD-10-CM | POA: Insufficient documentation

## 2022-08-12 DIAGNOSIS — G8929 Other chronic pain: Secondary | ICD-10-CM | POA: Insufficient documentation

## 2022-08-12 DIAGNOSIS — R252 Cramp and spasm: Secondary | ICD-10-CM | POA: Diagnosis present

## 2022-08-12 NOTE — Telephone Encounter (Signed)
PAPERWORK/FORMS received  Dropped off by: PT Call back #: [phone] Individual made aware of 3-5 business day turn around (YES/NO): Y GREEN charge sheet completed and patient made aware of possible charge (YES/NO): Y Placed in provider folder at front desk. ~~~ route to CMA/provider Team  CLINICAL USE BELOW THIS LINE (use X to signify action taken)  ___ Form received and placed in providers office for signature. ___ Form completed and faxed to LOA Dept.  ___ Form completed & LVM to notify patient ready for pick up.  ___ Charge sheet and copy of form in front office folder for office supervisor.

## 2022-08-12 NOTE — Therapy (Signed)
OUTPATIENT PHYSICAL THERAPY TREATMENT   Patient Name: Paula Rojas MRN: [id number] DOB:Aug 18, 1981, 41 y.o., female Today's Date: 08/12/2022   PT End of Session - 08/12/22 1638     Visit Number 4    Number of Visits 12    Date for PT Re-Evaluation 09/02/22    Authorization Type BCBS    PT Start Time 1535    PT Stop Time 1619    PT Time Calculation (min) 44 min    Activity Tolerance Patient tolerated treatment well    Behavior During Therapy [facility] for tasks assessed/performed               History reviewed. No pertinent past medical history. Past Surgical History:  Procedure Laterality Date   CESAREAN SECTION     CESAREAN SECTION  02/27/2012   Procedure: CESAREAN SECTION;  Surgeon: Marvene Staff, MD;  Location: [facility];  Service: Gynecology;  Laterality: N/A;   Patient Active Problem List   Diagnosis Date Noted   History of gestational diabetes 07/14/2022   Chronic bilateral low back pain with bilateral sciatica 07/14/2022   History of herniated intervertebral disc 07/14/2022   Amenorrhea 07/14/2022   Constipation, chronic 07/14/2022   Gestational diabetes mellitus 12/02/2011    PCP: Flossie Buffy, NP  REFERRING PROVIDER: Flossie Buffy, NP  REFERRING DIAG: M54.42,M54.41,G89.29 (ICD-10-CM) - Chronic bilateral low back pain with bilateral sciatica   Rationale for Evaluation and Treatment: Rehabilitation  THERAPY DIAG:  Chronic right-sided low back pain with right-sided sciatica  Cramp and spasm  ONSET DATE: 07/13/2022  SUBJECTIVE:                                                                                                                                                                                           SUBJECTIVE STATEMENT: Pt notes some hip flexor discomfort mostly when doing household chores; such as mopping and bending over.  PERTINENT HISTORY:  C-section x 2, previous episode low back pain and herniated disc 2017  PAIN:  Are  you having pain? Yes: NPRS scale: up to 8/10 Pain location: R side low back, occasional tingling down leg, big toe hurts (feels jammed) Pain description: dull ache Aggravating factors: standing, moving, walking more than short distances Relieving factors: sitting, leaning back, laying down  PRECAUTIONS: None  WEIGHT BEARING RESTRICTIONS: No  FALLS:  Has patient fallen in last 6 months? 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   NEXT MD VISIT: 09/15/2022  OBJECTIVE:   DIAGNOSTIC FINDINGS:  07/14/2022 DG Lumbar spine IMPRESSION: Degenerative disc disease at L5-S1. No evidence for acute abnormality.  PATIENT SURVEYS:  Modified Oswestry 13/50= 26% - moderate disability    SCREENING FOR RED FLAGS: Bowel or bladder incontinence: No Spinal tumors: No Cauda equina syndrome: No Compression fracture: No Abdominal aneurysm: No  COGNITION: Overall cognitive status: Within functional limits for tasks assessed     SENSATION: Light touch: WFL  MUSCLE LENGTH: Hamstrings: Right  90 deg; Left 90 deg Prone knee bend test: Right 90 deg; Left 90 deg  POSTURE: increased lumbar lordosis  PALPATION: Tenderness L5/SI, bil SIJ, bil glut med and piriformis (R>>L)  LUMBAR ROM:   AROM eval  Flexion To toes  Extension WNL, pain R side low back   Right lateral flexion To knees pulling  Left lateral flexion To knees, pulling  Right rotation WNL  Left rotation WNL   (Blank rows = not tested)  LOWER EXTREMITY ROM:   good hip mobility bil,  no pain  LOWER EXTREMITY MMT:    MMT Right eval Left eval  Hip flexion 5 5  Hip extension 4+ 5  Hip abduction 5 5  Hip adduction 5 5  Knee flexion 5 5  Knee extension 4+ 4+  Ankle dorsiflexion 5 5  Ankle plantarflexion     (Blank rows = not tested)  LUMBAR SPECIAL TESTS:  Straight leg raise test: Negative, FABER test: Negative, and Long sit test: Positive  for R rotation innominate, negative retest  FUNCTIONAL TESTS:  5 times sit to stand: 10 seconds without UE assist.    GAIT: Distance walked: 50' Assistive device utilized: None Level of assistance: Complete Independence Comments: no deviation or device   TODAY'S TREATMENT:                                                                                                                              DATE:  08/12/22 Bike L2x50mn Prone quad stretch with strap 2x30" Simulation mopping  Gastroc stretch 2x30 R LE Quadruped arm raises x 10 bil Quadruped glute kicks x 10 bil Seated lumbar extension blue TB 2x10  STM to R lumbar paraspinals, QL, glute med  08/05/2022 Therapeutic Exercise: to improve strength and mobility.  Demo, verbal and tactile cues throughout for technique. Bike L2 x 5 min  Supine hip IR/ER active stretch PPT x 10 Bent knee falls outs with TrA x 10 bil  Bridges x 10 Quadruped leg extensions x 10 bil  Bird dogs x 10 bil  Child pose stretch Manual Therapy: to decrease muscle spasm and pain and improve mobility IASTM with foam roller to bil glutes,  QL, lumbar paraspinals, STM/TPR to R glut med and piriformis, L glut medius, skilled palpation and monitoring during dry needling. Trigger Point Dry-Needling  Treatment instructions: Expect mild to moderate muscle soreness. S/S of pneumothorax if dry needled over a lung field, and to seek immediate medical attention should they occur. Patient verbalized understanding of these instructions and education. Patient Consent Given: Yes Education handout provided:  Yes Muscles treated: R piriformis, R glut med, L glut med Electrical stimulation performed: No Parameters: N/A Treatment response/outcome: Twitch Response Elicited and Palpable Increase in Muscle Length  07/22/2022 Therapeutic Exercise: to improve strength and mobility.  Demo, verbal and tactile cues throughout for technique. Supine PPT x 10 Bridge with TrA brace 2x10 Isometric 90/90 hips 5x10" Prone HS curls 1 set x 10 no weight: 2nd set manual resistance - TB to use at home Prone hip extension x 10 BLE Prone press up x 10 Seated hip hinge 2x10 yellow wt ball  Chest press yellow weight ball 2x10 Standing pallof press blue TB x 10  07/22/2022 Therapeutic Exercise: to improve strength and mobility.  Demo, verbal and tactile cues throughout for technique. MET with resisted R hip flexion 5 x 5 sec isometric holds, L hip extension 5 x 5 sec holds, followed by hip abduction 5 x 5 sec hold, and adduction 5 x 5 sec hold to set.   Prone knee bends x 10 bil Prone hip extension x 10 bil  Supine PPT x 10    PATIENT EDUCATION:  Education details: Dry needling Person educated: Patient Education method: Explanation and Handouts Education comprehension: verbalized understanding  HOME EXERCISE PROGRAM: Access Code: [email or web address] URL: [email or web address] Date: 08/12/2022 Prepared by: Clarene Essex  Exercises - Supine Posterior Pelvic Tilt  - 1 x daily - 7 x weekly - 2 sets - 10 reps - Isometric Dead Bug  - 1 x daily - 7 x weekly - 2 sets - 5 reps - 10 second hold - Prone Press Up  - 1 x daily - 7 x weekly - 2 sets - 10 reps - 5 sec hold - Prone Hip Extension with Pillow Under Abdomen  - 1 x daily - 7 x weekly - 2 sets - 10 reps - Standing Anti-Rotation Press with Anchored Resistance  - 1 x daily - 7 x weekly - 2 sets - 10 reps - Quadruped Alternating Arm Lift  - 1 x daily - 7 x weekly - 2 sets - 10 reps - Quadruped Alternating Leg Extensions  - 1 x daily - 7 x weekly - 2 sets -  10 reps - Prone Quadriceps Stretch with Strap  - 1 x daily - 7 x weekly - 2 sets - 30 sec hold  ASSESSMENT:  CLINICAL IMPRESSION: Pt today had reports of difficulty with mopping and sweeping, and anything that required her to bend over. We did work on mopping with emphasis on core bracing and good body mechanics. Continued to work on lumbar stabilization to improve support and muscle recruitment with ADLs. Provided cues as needed with exercises. Improvement in pain and muscle tension post MT   OBJECTIVE IMPAIRMENTS: decreased activity tolerance, decreased mobility, decreased strength, increased fascial restrictions, impaired perceived functional ability, increased muscle spasms, and pain.   ACTIVITY LIMITATIONS: carrying, lifting, bending, standing, sleeping, stairs, and locomotion level  PARTICIPATION LIMITATIONS: meal prep, cleaning, laundry, shopping, community activity, and yard work  PERSONAL FACTORS: 1 comorbidity: Chronic back pain  are also affecting patient's functional outcome.   REHAB POTENTIAL: Excellent  CLINICAL DECISION MAKING: Stable/uncomplicated  EVALUATION COMPLEXITY: Low   GOALS: Goals reviewed with patient? Yes  SHORT TERM GOALS: Target date: 08/05/2022   Patient will be independent with initial HEP.  Baseline: given Goal status: MET 08/05/22   LONG TERM GOALS: Target date: 09/02/2022    Patient will be independent with advanced/ongoing HEP to improve outcomes and carryover.  Baseline: needs progression  Goal status: IN PROGRESS  2.  Patient will report 75% improvement in low back pain to improve QOL.  Baseline:  Goal status: IN PROGRESS  3.  Patient will demonstrate full pain free lumbar ROM to perform ADLs.   Baseline: see objective Goal status: IN PROGRESS  4.  Patient will demonstrate improved functional strength as demonstrated by 5/5 R hip extensor/hamstring strength. Baseline: 4+/5 Goal status: IN PROGRESS  5.  Patient will report 12%  improvement on modified Oswestry to demonstrate improved functional ability.  Baseline: 26% Goal status: IN PROGRESS   6.  Patient will tolerate 15 min of brisk walking to start walking with children for exercise.  Baseline: unable Goal status: IN PROGRESS  7.  Patient will be able to perform all desired activities without limitation from low back pain. Baseline:  Goal status: IN PROGRESS   PLAN:  PT FREQUENCY: 1-2x/week  PT DURATION: 6 weeks  PLANNED INTERVENTIONS: Therapeutic exercises, Therapeutic activity, Neuromuscular re-education, Balance training, Gait training, Patient/Family education, Self Care, Joint mobilization, Stair training, Dry Needling, Electrical stimulation, Spinal mobilization, Cryotherapy, Moist heat, Traction, Ultrasound, Manual therapy, and Re-evaluation.  PLAN FOR NEXT SESSION: progress core strengthening, starting neutral spine, progressing to more extension, manual therapy, dry needling low back, R piriformis/glutes if agreeable, modalities PRN.    Artist Pais, PTA 08/12/2022, 4:39 PM

## 2022-08-13 NOTE — Telephone Encounter (Signed)
Forms received and have been placed on providers desk for completion. Please allow 3-5 business days to complete.

## 2022-08-13 NOTE — Telephone Encounter (Signed)
My Chart message sent

## 2022-08-19 ENCOUNTER — Ambulatory Visit: Payer: BC Managed Care – PPO | Admitting: Physical Therapy

## 2022-08-19 ENCOUNTER — Encounter: Payer: Self-pay | Admitting: Physical Therapy

## 2022-08-19 DIAGNOSIS — R252 Cramp and spasm: Secondary | ICD-10-CM

## 2022-08-19 DIAGNOSIS — M5441 Lumbago with sciatica, right side: Secondary | ICD-10-CM | POA: Diagnosis not present

## 2022-08-19 DIAGNOSIS — G8929 Other chronic pain: Secondary | ICD-10-CM

## 2022-08-19 NOTE — Therapy (Signed)
OUTPATIENT PHYSICAL THERAPY TREATMENT   Patient Name: Graceann Boileau MRN: [id number] DOB:28-Mar-1981, 41 y.o., female Today's Date: 08/19/2022   PT End of Session - 08/19/22 1449     Visit Number 5    Number of Visits 12    Date for PT Re-Evaluation 09/02/22    Authorization Type BCBS    PT Start Time 1448    PT Stop Time 1529    PT Time Calculation (min) 41 min    Activity Tolerance Patient tolerated treatment well    Behavior During Therapy [facility] for tasks assessed/performed               History reviewed. No pertinent past medical history. Past Surgical History:  Procedure Laterality Date   CESAREAN SECTION     CESAREAN SECTION  02/27/2012   Procedure: CESAREAN SECTION;  Surgeon: Marvene Staff, MD;  Location: [facility];  Service: Gynecology;  Laterality: N/A;   Patient Active Problem List   Diagnosis Date Noted   History of gestational diabetes 07/14/2022   Chronic bilateral low back pain with bilateral sciatica 07/14/2022   History of herniated intervertebral disc 07/14/2022   Amenorrhea 07/14/2022   Constipation, chronic 07/14/2022   Gestational diabetes mellitus 12/02/2011    PCP: Flossie Buffy, NP  REFERRING PROVIDER: Flossie Buffy, NP  REFERRING DIAG: M54.42,M54.41,G89.29 (ICD-10-CM) - Chronic bilateral low back pain with bilateral sciatica   Rationale for Evaluation and Treatment: Rehabilitation  THERAPY DIAG:  Chronic right-sided low back pain with right-sided sciatica  Cramp and spasm  ONSET DATE: 07/13/2022  SUBJECTIVE:                                                                                                                                                                                           SUBJECTIVE STATEMENT: Patient reports feeling better, mostly just noticing hip pain in mornings now.  Still get tightness standing, washing dishes, sweeping floor.   PERTINENT HISTORY:  C-section x 2, previous episode low back pain  and herniated disc 2017  PAIN:  Are you having pain? Yes: NPRS scale: 2/10 Pain location: L hip/glute Pain description: dull ache Aggravating factors: standing, moving, walking more than short distances Relieving factors: sitting, leaning back, laying down  PRECAUTIONS: None  WEIGHT BEARING RESTRICTIONS: No  FALLS:  Has patient fallen in last 6 months? 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   NEXT MD VISIT: 09/15/2022  OBJECTIVE:   DIAGNOSTIC FINDINGS:  07/14/2022 DG Lumbar spine IMPRESSION: Degenerative disc disease at L5-S1. No evidence for acute abnormality.  PATIENT SURVEYS:  Modified Oswestry 13/50= 26% - moderate disability    SCREENING FOR RED FLAGS: Bowel or bladder incontinence: No Spinal tumors: No Cauda equina syndrome: No Compression fracture: No Abdominal aneurysm: No  COGNITION: Overall cognitive status: Within functional limits for tasks assessed     SENSATION: Light touch: WFL  MUSCLE LENGTH: Hamstrings: Right  90 deg; Left 90 deg Prone knee bend test: Right 90 deg; Left 90 deg  POSTURE: increased lumbar lordosis  PALPATION: Tenderness L5/SI, bil SIJ, bil glut med and piriformis (R>>L)  LUMBAR ROM:   AROM eval  Flexion To toes  Extension WNL, pain R side low back   Right lateral flexion To knees pulling  Left lateral flexion To knees, pulling  Right rotation WNL  Left rotation WNL   (Blank rows = not tested)  LOWER EXTREMITY ROM:   good hip mobility bil, no pain  LOWER EXTREMITY MMT:     MMT Right eval Left eval  Hip flexion 5 5  Hip extension 4+ 5  Hip abduction 5 5  Hip adduction 5 5  Knee flexion 5 5  Knee extension 4+ 4+  Ankle dorsiflexion 5 5  Ankle plantarflexion     (Blank rows = not tested)  LUMBAR SPECIAL TESTS:  Straight leg raise test: Negative, FABER test: Negative, and Long sit test: Positive  for R rotation innominate, negative retest  FUNCTIONAL TESTS:  5 times sit to stand: 10 seconds without UE assist.    GAIT: Distance walked: 50' Assistive device utilized: None Level of assistance: Complete Independence Comments: no deviation or device   TODAY'S TREATMENT:                                                                                                                              DATE:   08/19/2022 Therapeutic Exercise: to improve strength and mobility.  Demo, verbal and tactile cues throughout for technique. Rec L2 x 6 min PPT x 10  Bridge with TrA x 10  LTR x 10  Bent knee fall outs x 10 bil  SLR x 10  with TrA Cat cows x 10  Quadruped leg extensions x 10 bil  Seated piriformis stretches - 3 ways bil  Manual Therapy: to decrease muscle spasm and pain and improve mobility IASTM with foam roller to bil glutes and lumbar paraspinals, MFR to bil QL, UPA mobs lumbar spine.   08/12/22 Bike L2x21mn Prone quad stretch with strap 2x30" Simulation mopping  Gastroc stretch 2x30 R LE Quadruped arm raises x 10 bil Quadruped glute kicks x 10 bil  Seated lumbar extension blue TB 2x10  STM to R lumbar paraspinals, QL, glute med  08/05/2022 Therapeutic Exercise: to improve strength and mobility.  Demo, verbal and tactile cues throughout for technique. Bike L2 x 5 min  Supine hip IR/ER active stretch PPT x 10 Bent knee falls outs with TrA x 10 bil  Bridges x 10 Quadruped leg extensions x 10 bil  Bird dogs x 10 bil  Child pose stretch Manual Therapy: to decrease muscle spasm and pain and improve mobility IASTM with foam roller to bil  glutes, QL, lumbar paraspinals, STM/TPR to R glut med and piriformis, L glut medius, skilled palpation and monitoring during dry needling. Trigger Point Dry-Needling  Treatment instructions: Expect mild to moderate muscle soreness. S/S of pneumothorax if dry needled over a lung field, and to seek immediate medical attention should they occur. Patient verbalized understanding of these instructions and education. Patient Consent Given: Yes Education handout provided: Yes Muscles treated: R piriformis, R glut med, L glut med Electrical stimulation performed: No Parameters: N/A Treatment response/outcome: Twitch Response Elicited and Palpable Increase in Muscle Length    PATIENT EDUCATION:  Education details: HEP update 08/19/22 added piriformis stretches Person educated: Patient Education method: Explanation and Handouts Education comprehension: verbalized understanding  HOME EXERCISE PROGRAM: Access Code: [email or web address]  ASSESSMENT:  CLINICAL IMPRESSION: Edilia Ghuman reports improvement in low back pain, still has stiffness with ADLs.  Reporting more pain in glutes/hip region now.  Today focused on progressing exercises, tolerated well, reported feeling they were targeting the correct muscles, especially seated piriformis stretches.  No pain reported end of session.  Britainy Kozub continues to demonstrate potential for improvement and would benefit from continued skilled therapy to address impairments.      OBJECTIVE IMPAIRMENTS: decreased activity tolerance, decreased mobility, decreased strength, increased fascial restrictions, impaired perceived functional ability, increased muscle spasms, and pain.   ACTIVITY LIMITATIONS: carrying, lifting, bending, standing, sleeping, stairs, and locomotion level  PARTICIPATION LIMITATIONS: meal prep, cleaning, laundry, shopping, community activity, and yard work  PERSONAL FACTORS: 1 comorbidity: Chronic back pain  are also affecting patient's  functional outcome.   REHAB POTENTIAL: Excellent  CLINICAL DECISION MAKING: Stable/uncomplicated  EVALUATION COMPLEXITY: Low   GOALS: Goals reviewed with patient? Yes  SHORT TERM GOALS: Target date: 08/05/2022   Patient will be independent with initial HEP.  Baseline: given Goal status: MET 08/05/22   LONG TERM GOALS: Target date: 09/02/2022    Patient will be independent with advanced/ongoing HEP to improve outcomes and carryover.  Baseline: needs progression  Goal status: IN PROGRESS  2.  Patient will report 75% improvement in low back pain to improve QOL.  Baseline:  Goal status: IN PROGRESS  3.  Patient will demonstrate full pain free lumbar ROM to perform ADLs.   Baseline: see objective Goal status: IN PROGRESS  4.  Patient will demonstrate improved functional strength as demonstrated by 5/5 R hip extensor/hamstring strength. Baseline: 4+/5 Goal status: IN PROGRESS  5.  Patient will report 12% improvement on modified Oswestry to demonstrate improved functional ability.  Baseline: 26% Goal status: IN PROGRESS   6.  Patient will tolerate 15 min of brisk walking to start walking with children for exercise.  Baseline: unable Goal status: IN PROGRESS  7.  Patient will be able to perform all desired activities without limitation from low back pain. Baseline:  Goal status: IN PROGRESS   PLAN:  PT FREQUENCY: 1-2x/week  PT DURATION: 6 weeks  PLANNED INTERVENTIONS: Therapeutic exercises, Therapeutic activity, Neuromuscular  re-education, Balance training, Gait training, Patient/Family education, Self Care, Joint mobilization, Stair training, Dry Needling, Electrical stimulation, Spinal mobilization, Cryotherapy, Moist heat, Traction, Ultrasound, Manual therapy, and Re-evaluation.  PLAN FOR NEXT SESSION: continue to progress core/hip strengthening as tolerated.    Rennie Natter, PT, DPT  08/19/2022, 3:45 PM

## 2022-08-22 ENCOUNTER — Ambulatory Visit: Payer: BC Managed Care – PPO

## 2022-08-22 DIAGNOSIS — M5441 Lumbago with sciatica, right side: Secondary | ICD-10-CM | POA: Diagnosis not present

## 2022-08-22 DIAGNOSIS — R252 Cramp and spasm: Secondary | ICD-10-CM

## 2022-08-22 DIAGNOSIS — G8929 Other chronic pain: Secondary | ICD-10-CM

## 2022-08-22 NOTE — Therapy (Signed)
OUTPATIENT PHYSICAL THERAPY TREATMENT   Patient Name: Paula Rojas MRN: [id number] DOB:1980-11-11, 41 y.o., female Today's Date: 08/22/2022   PT End of Session - 08/22/22 1152     Visit Number 6    Number of Visits 12    Date for PT Re-Evaluation 09/02/22    Authorization Type BCBS    PT Start Time 1103    PT Stop Time 1156    PT Time Calculation (min) 53 min    Activity Tolerance Patient tolerated treatment well    Behavior During Therapy [place] [facility] for tasks assessed/performed               History reviewed. No pertinent past medical history. Past Surgical History:  Procedure Laterality Date   CESAREAN SECTION     CESAREAN SECTION  02/27/2012   Procedure: CESAREAN SECTION;  Surgeon: Marvene Staff, MD;  Location: [facility];  Service: Gynecology;  Laterality: N/A;   Patient Active Problem List   Diagnosis Date Noted   History of gestational diabetes 07/14/2022   Chronic bilateral low back pain with bilateral sciatica 07/14/2022   History of herniated intervertebral disc 07/14/2022   Amenorrhea 07/14/2022   Constipation, chronic 07/14/2022   Gestational diabetes mellitus 12/02/2011    PCP: Flossie Buffy, NP  REFERRING PROVIDER: Flossie Buffy, NP  REFERRING DIAG: M54.42,M54.41,G89.29 (ICD-10-CM) - Chronic bilateral low back pain with bilateral sciatica   Rationale for Evaluation and Treatment: Rehabilitation  THERAPY DIAG:  Chronic right-sided low back pain with right-sided sciatica  Cramp and spasm  ONSET DATE: 07/13/2022  SUBJECTIVE:                                                                                                                                                                                           SUBJECTIVE STATEMENT: Pt reports slight pain in low back.  PERTINENT HISTORY:  C-section x 2, previous episode low back pain and herniated disc 2017  PAIN:  Are you having pain? Yes: NPRS scale: 1/10 Pain location: L  hip/glute Pain description: dull ache Aggravating factors: standing, moving, walking more than short distances Relieving factors: sitting, leaning back, laying down  PRECAUTIONS: None  WEIGHT BEARING RESTRICTIONS: No  FALLS:  Has patient fallen in last 6 months? 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   NEXT MD VISIT: 09/15/2022  OBJECTIVE:   DIAGNOSTIC FINDINGS:  07/14/2022 DG Lumbar spine IMPRESSION: Degenerative disc disease at L5-S1. No evidence for acute abnormality.  PATIENT SURVEYS:  Modified Oswestry 13/50= 26% - moderate disability    SCREENING FOR RED FLAGS: Bowel or bladder incontinence: No Spinal tumors: No Cauda equina syndrome: No Compression fracture: No Abdominal aneurysm: No  COGNITION: Overall cognitive status: Within functional limits for tasks assessed     SENSATION: Light touch: WFL  MUSCLE LENGTH: Hamstrings: Right  90 deg; Left 90 deg Prone knee bend test: Right 90 deg; Left 90 deg  POSTURE: increased lumbar lordosis  PALPATION: Tenderness L5/SI, bil SIJ, bil glut med and piriformis (R>>L)  LUMBAR ROM:   AROM eval 08/22/22  Flexion To toes WNL  Extension WNL, pain R side low back  WNL -mild pull in low back  Right lateral flexion To knees pulling To mid fibula  Left lateral flexion To knees, pulling To mid fibula  Right rotation WNL WNL  Left rotation WNL WNL   (Blank rows = not tested)  LOWER EXTREMITY ROM:   good hip mobility bil, no pain  LOWER EXTREMITY MMT:    MMT Right eval  Left eval  Hip flexion 5 5  Hip extension 4+ 5  Hip abduction 5 5  Hip adduction 5 5  Knee flexion 5 5  Knee extension 4+ 4+  Ankle dorsiflexion 5 5  Ankle plantarflexion     (Blank rows = not tested)  LUMBAR SPECIAL TESTS:  Straight leg raise test: Negative, FABER test: Negative, and Long sit test: Positive  for R rotation innominate, negative retest  FUNCTIONAL TESTS:  5 times sit to stand: 10 seconds without UE assist.    GAIT: Distance walked: 50' Assistive device utilized: None Level of assistance: Complete Independence Comments: no deviation or device   TODAY'S TREATMENT:                                                                                                                              DATE:   08/22/22 Therapeutic Exercise: to improve strength and mobility.  Demo, verbal and tactile cues throughout for technique. Rec L2 x 6 min Quadruped hip extensions x 10 bil Bird dog x 10 bil Seated pelvic tilts x 10  Seated lumabar extension blue TB x 10  Standing pallof press blue TB 2x10 bil Standing alt diagonals x 10 GTB Seated row 25lb 2x10  08/19/2022 Therapeutic Exercise: to improve strength and mobility.  Demo, verbal and tactile cues throughout for technique. Rec L2 x 6 min PPT x 10  Bridge with TrA x 10  LTR x 10  Bent knee fall outs x 10 bil  SLR x 10  with TrA Cat cows x 10  Quadruped leg extensions x 10 bil  Seated piriformis  stretches - 3 ways bil  Manual Therapy: to decrease muscle spasm and pain and improve mobility IASTM with foam roller to bil glutes and lumbar paraspinals, MFR to bil QL, UPA mobs lumbar spine.   08/12/22 Bike L2x50mn Prone quad stretch with strap 2x30" Simulation mopping  Gastroc stretch 2x30 R LE Quadruped arm raises x 10 bil Quadruped glute kicks x 10 bil Seated lumbar extension blue TB 2x10  STM to R lumbar paraspinals, QL, glute med   PATIENT EDUCATION:  Education details: HEP update 08/19/22 added piriformis  stretches Person educated: Patient Education method: Explanation and Handouts Education comprehension: verbalized understanding  HOME EXERCISE PROGRAM: Access Code: [email or web address]  ASSESSMENT:  CLINICAL IMPRESSION: Pt demonstrates improved lumbar AROM today. Improvements were made in bil SB and no pain reported with extension just a slight pull. She met LTG #2 (75% improvement in pain). She also met LTG#7 (to be able to do household chores w/o being limited by pain).Cues were required with bird dog exercises to keep neutral spine. She demonstrated a good performance of the exercises. She would be ready for D/C on her next visit d/t progress.    OBJECTIVE IMPAIRMENTS: decreased activity tolerance, decreased mobility, decreased strength, increased fascial restrictions, impaired perceived functional ability, increased muscle spasms, and pain.   ACTIVITY LIMITATIONS: carrying, lifting, bending, standing, sleeping, stairs, and locomotion level  PARTICIPATION LIMITATIONS: meal prep, cleaning, laundry, shopping, community activity, and yard work  PERSONAL FACTORS: 1 comorbidity: Chronic back pain  are also affecting patient's functional outcome.   REHAB POTENTIAL: Excellent  CLINICAL DECISION MAKING: Stable/uncomplicated  EVALUATION COMPLEXITY: Low   GOALS: Goals reviewed with patient? Yes  SHORT TERM GOALS: Target date: 08/05/2022   Patient will be independent with initial HEP.  Baseline: given Goal status: MET 08/05/22   LONG TERM GOALS: Target date: 09/02/2022    Patient will be independent with advanced/ongoing HEP to improve outcomes and carryover.  Baseline: needs progression  Goal status: IN PROGRESS  2.  Patient will report 75% improvement in low back pain to improve QOL.  Baseline:  Goal status: MET - 08/22/22  3.  Patient will demonstrate full pain free lumbar ROM to perform ADLs.   Baseline: see objective Goal status: IN PROGRESS- 08/22/22 check AROM chart  4.   Patient will demonstrate improved functional strength as demonstrated by 5/5 R hip extensor/hamstring strength. Baseline: 4+/5 Goal status: IN PROGRESS  5.  Patient will report 12% improvement on modified Oswestry to demonstrate improved functional ability.  Baseline: 26% Goal status: IN PROGRESS   6.  Patient will tolerate 15 min of brisk walking to start walking with children for exercise.  Baseline: unable Goal status: IN PROGRESS  7.  Patient will be able to perform all desired activities without limitation from low back pain. Baseline:  Goal status: MET- 08/22/22   PLAN:  PT FREQUENCY: 1-2x/week  PT DURATION: 6 weeks  PLANNED INTERVENTIONS: Therapeutic exercises, Therapeutic activity, Neuromuscular re-education, Balance training, Gait training, Patient/Family education, Self Care, Joint mobilization, Stair training, Dry Needling, Electrical stimulation, Spinal mobilization, Cryotherapy, Moist heat, Traction, Ultrasound, Manual therapy, and Re-evaluation.  PLAN FOR NEXT SESSION: continue to progress core/hip strengthening as tolerated.    BArtist Pais PTA 08/22/2022, 12:04 PM

## 2022-08-25 ENCOUNTER — Encounter: Payer: Self-pay | Admitting: Physical Therapy

## 2022-08-25 ENCOUNTER — Ambulatory Visit: Payer: BC Managed Care – PPO | Admitting: Physical Therapy

## 2022-08-25 DIAGNOSIS — R252 Cramp and spasm: Secondary | ICD-10-CM

## 2022-08-25 DIAGNOSIS — G8929 Other chronic pain: Secondary | ICD-10-CM

## 2022-08-25 DIAGNOSIS — M5441 Lumbago with sciatica, right side: Secondary | ICD-10-CM | POA: Diagnosis not present

## 2022-08-25 NOTE — Therapy (Addendum)
OUTPATIENT PHYSICAL THERAPY TREATMENT   Patient Name: Paula Rojas MRN: [id number] DOB:03-02-1981, 41 y.o., female Today's Date: 08/25/2022   PT End of Session - 08/25/22 1403     Visit Number 7    Number of Visits 12    Date for PT Re-Evaluation 09/02/22    Authorization Type BCBS    PT Start Time 1401    PT Stop Time 1448    PT Time Calculation (min) 47 min    Activity Tolerance Patient tolerated treatment well    Behavior During Therapy [facility] for tasks assessed/performed               History reviewed. No pertinent past medical history. Past Surgical History:  Procedure Laterality Date   CESAREAN SECTION     CESAREAN SECTION  02/27/2012   Procedure: CESAREAN SECTION;  Surgeon: Marvene Staff, MD;  Location: [facility];  Service: Gynecology;  Laterality: N/A;   Patient Active Problem List   Diagnosis Date Noted   History of gestational diabetes 07/14/2022   Chronic bilateral low back pain with bilateral sciatica 07/14/2022   History of herniated intervertebral disc 07/14/2022   Amenorrhea 07/14/2022   Constipation, chronic 07/14/2022   Gestational diabetes mellitus 12/02/2011    PCP: Flossie Buffy, NP  REFERRING PROVIDER: Flossie Buffy, NP  REFERRING DIAG: M54.42,M54.41,G89.29 (ICD-10-CM) - Chronic bilateral low back pain with bilateral sciatica   Rationale for Evaluation and Treatment: Rehabilitation  THERAPY DIAG:  Chronic right-sided low back pain with right-sided sciatica  Cramp and spasm  ONSET DATE: 07/13/2022  SUBJECTIVE:                                                                                                                                                                                           SUBJECTIVE STATEMENT: Pt reports just tightness in R hip today.   PERTINENT HISTORY:  C-section x 2, previous episode low back pain and herniated disc 2017  PAIN:  Are you having pain? No  PRECAUTIONS: None  WEIGHT BEARING  RESTRICTIONS: No  FALLS:  Has patient fallen in last 6 months? 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   NEXT MD  VISIT: 09/15/2022  OBJECTIVE:   DIAGNOSTIC FINDINGS:  07/14/2022 DG Lumbar spine IMPRESSION: Degenerative disc disease at L5-S1. No evidence for acute abnormality.  PATIENT SURVEYS:  Modified Oswestry 13/50= 26% - moderate disability    SCREENING FOR RED FLAGS: Bowel or bladder incontinence: No Spinal tumors: No Cauda equina syndrome: No Compression fracture: No Abdominal aneurysm: No  COGNITION: Overall cognitive status: Within functional limits for tasks assessed     SENSATION: Light touch: WFL  MUSCLE LENGTH: Hamstrings: Right  90 deg; Left 90 deg Prone knee bend test: Right 90 deg; Left 90 deg  POSTURE: increased lumbar lordosis  PALPATION: Tenderness L5/SI, bil SIJ, bil glut med and piriformis (R>>L)  LUMBAR ROM:   AROM eval 08/22/22  Flexion To toes WNL  Extension WNL, pain R side low back  WNL -mild pull in low back  Right lateral flexion To knees pulling To mid fibula  Left lateral flexion To knees, pulling To mid fibula  Right rotation WNL WNL  Left rotation WNL WNL   (Blank rows = not tested)  LOWER EXTREMITY ROM:   good hip mobility bil, no pain  LOWER EXTREMITY MMT:    MMT Right eval Left eval  Hip flexion 5 5  Hip extension 4+ 5  Hip abduction 5 5  Hip adduction 5 5  Knee flexion 5 5  Knee extension 4+ 4+  Ankle dorsiflexion 5 5  Ankle plantarflexion     (Blank rows = not  tested)  LUMBAR SPECIAL TESTS:  Straight leg raise test: Negative, FABER test: Negative, and Long sit test: Positive  for R rotation innominate, negative retest  FUNCTIONAL TESTS:  5 times sit to stand: 10 seconds without UE assist.    GAIT: Distance walked: 50' Assistive device utilized: None Level of assistance: Complete Independence Comments: no deviation or device   TODAY'S TREATMENT:                                                                                                                              DATE:  08/25/2022 Therapeutic Exercise: to improve strength and mobility.  Demo, verbal and tactile cues throughout for technique. Rec bike L2 x 6 min  Side lunges with slider x 10 bil  Open books x 10 bil  Hip IR/ER active stretch Hip extension foot on slider x 15 bil  Bird dogs x 10 Child pose, child pose with sidebend stretch Bil shoulder extension GTB x 15 Chops x 15 bil GTB Manual Therapy: to decrease muscle spasm and pain and improve mobility; STM/TPR to R glut medius/piriformis.   08/22/22 Therapeutic Exercise: to improve strength and mobility.  Demo, verbal and tactile cues throughout for technique. Rec L2 x 6 min Quadruped hip extensions x 10 bil Bird dog x 10 bil Seated pelvic tilts x 10  Seated lumabar extension blue TB x 10  Standing pallof press blue TB 2x10 bil Standing alt diagonals x 10 GTB Seated row 25lb 2x10  08/19/2022 Therapeutic Exercise: to improve strength and mobility.  Demo, verbal and tactile cues throughout for technique. Rec L2 x 6 min PPT x 10  Bridge with TrA x 10  LTR x 10  Bent knee fall outs x 10 bil  SLR x 10  with TrA Cat cows x 10  Quadruped leg extensions x 10 bil  Seated piriformis stretches - 3 ways bil  Manual Therapy: to decrease muscle spasm and pain and improve mobility IASTM with foam roller to bil glutes and lumbar paraspinals, MFR to bil QL, UPA mobs lumbar spine.   08/12/22 Bike L2x103mn Prone quad stretch  with strap 2x30" Simulation mopping  Gastroc stretch 2x30 R LE Quadruped arm raises x 10 bil Quadruped glute kicks x 10 bil Seated lumbar extension blue TB 2x10  STM to R lumbar paraspinals, QL, glute med   PATIENT EDUCATION:  Education details: HEP update 08/25/22 Person educated: Patient Education method: Explanation and Handouts Education comprehension: verbalized understanding  HOME EXERCISE PROGRAM: Access Code: [email or web address] URL: [email or web address] Date: 08/25/2022 Prepared by: EGlenetta Hew Exercises - Supine Posterior Pelvic Tilt  - 1 x daily - 7 x weekly - 2 sets - 10 reps - Isometric Dead Bug  - 1 x daily - 7 x weekly - 2 sets - 5 reps - 10 second hold - Prone Press Up  - 1 x daily - 7 x weekly - 2 sets - 10 reps - 5 sec hold - Standing Anti-Rotation Press with Anchored Resistance  - 1 x daily - 7 x weekly - 2 sets - 10 reps - Prone Quadriceps Stretch with Strap  - 1 x daily - 7 x weekly - 2 sets - 30 sec hold - Seated Piriformis Stretch with Trunk Bend  - 1 x daily - 7 x weekly - 1 sets - 2 reps - 30 sec  hold - Seated Piriformis Stretch  - 1 x daily - 7 x weekly - 1 sets - 2 reps - 30 sec  hold - Seated Piriformis Stretch  - 1 x daily - 7 x weekly - 1 sets - 2 reps - 30 sec  hold - Supine Active Straight Leg Raise  - 1 x daily - 7 x weekly - 3 sets - 10 reps - 3-Way Lunge on Slider  - 1 x daily - 7 x weekly - 3 sets - 10 reps - Sidelying Open Book Thoracic Lumbar Rotation and Extension  - 1 x daily - 7 x weekly - 1 sets - 10 reps - Supine Hip Internal and External Rotation  - 1 x daily - 7 x weekly - 2 sets - 10 reps - Supine Heel Slides  - 1 x daily - 7 x weekly - 2-3 sets - 10 reps - Bird Dog  - 1 x daily - 7 x weekly - 2-3 sets - 10 reps - Child's Pose with Sidebending  - 1 x daily - 7 x weekly - 1 sets - 3 reps - 15 sec  hold - Standing Diagonal Chop  - 1 x daily - 7 x weekly - 2 sets - 10 reps - Single-Leg RBeninDeadlift With Dumbbell  - 1 x  daily - 7 x weekly - 2 sets - 10 reps  ASSESSMENT:  CLINICAL IMPRESSION: ONakoma Gotwalthas made good progress in PT and only reports R hip tightness more than pain now.  She has met or almost met all goals, score only 12% on modified Oswestry (from 26%  at initial evaluation) and feels confident with continuing to work on exercises at home.  We reviewed and progressed exercises for hip/core strengthening and stretching, reported decreased tightness following exercises.  Brief manual therapy to R hip noted some tightness primarily in R glut med.  She would like to be placed on 30 day hold today, to be discharged if does not need to return to skilled therapy.    OBJECTIVE IMPAIRMENTS: decreased activity tolerance, decreased mobility, decreased strength, increased fascial restrictions, impaired perceived functional ability, increased muscle spasms, and pain.   ACTIVITY LIMITATIONS: carrying, lifting, bending, standing, sleeping, stairs, and locomotion level  PARTICIPATION LIMITATIONS: meal prep, cleaning, laundry, shopping, community activity, and yard work  PERSONAL FACTORS: 1 comorbidity: Chronic back pain  are also affecting patient's functional outcome.   REHAB POTENTIAL: Excellent  CLINICAL DECISION MAKING: Stable/uncomplicated  EVALUATION COMPLEXITY: Low   GOALS: Goals reviewed with patient? Yes  SHORT TERM GOALS: Target date: 08/05/2022   Patient will be independent with initial HEP.  Baseline: given Goal status: MET 08/05/22   LONG TERM GOALS: Target date: 09/02/2022    Patient will be independent with advanced/ongoing HEP to improve outcomes and carryover.  Baseline: needs progression  Goal status: MET 08/25/2022  2.  Patient will report 75% improvement in low back pain to improve QOL.  Baseline:  Goal status: MET - 08/22/22  3.  Patient will demonstrate full pain free lumbar ROM to perform ADLs.   Baseline: see objective Goal status: MET- 08/22/22 check AROM  chart  4.  Patient will demonstrate improved functional strength as demonstrated by 5/5 R hip extensor/hamstring strength. Baseline: 4+/5 Goal status: MET  5/5 R hip extensor and hamstring strength.   5.  Patient will report 12% improvement on modified Oswestry to demonstrate improved functional ability.  Baseline: 26% Goal status: MET 6/50 = 12%   6.  Patient will tolerate 15 min of brisk walking to start walking with children for exercise.  Baseline: unable Goal status: IN PROGRESS 08/25/22- can walk 15 min with occasional rest breaks.   7.  Patient will be able to perform all desired activities without limitation from low back pain. Baseline:  Goal status: MET- 08/22/22   PLAN:  PT FREQUENCY: 1-2x/week  PT DURATION: 6 weeks  PLANNED INTERVENTIONS: Therapeutic exercises, Therapeutic activity, Neuromuscular re-education, Balance training, Gait training, Patient/Family education, Self Care, Joint mobilization, Stair training, Dry Needling, Electrical stimulation, Spinal mobilization, Cryotherapy, Moist heat, Traction, Ultrasound, Manual therapy, and Re-evaluation.  PLAN FOR NEXT SESSION: 30 day hold   Rennie Natter, PT, DPT  08/25/2022, 4:21 PM   PHYSICAL THERAPY DISCHARGE SUMMARY  Visits from Start of Care: 7  Current functional level related to goals / functional outcomes: Improved low back pain   Remaining deficits: R hip pain   Education / Equipment: HEP  Plan: Patient agrees to discharge.  Patient is being discharged due to meeting the stated rehab goals.  She was placed on 30 day hold on 08/26/2023 and has not returned within stated time frame.    Rennie Natter, PT, DPT 8:47 AM 09/26/2022

## 2022-09-12 LAB — HM PAP SMEAR

## 2022-09-15 ENCOUNTER — Encounter: Payer: Self-pay | Admitting: Nurse Practitioner

## 2022-09-15 ENCOUNTER — Ambulatory Visit (INDEPENDENT_AMBULATORY_CARE_PROVIDER_SITE_OTHER): Payer: BC Managed Care – PPO | Admitting: Nurse Practitioner

## 2022-09-15 VITALS — BP 132/100 | HR 69 | Temp 96.9°F | Ht 60.0 in | Wt 249.6 lb

## 2022-09-15 DIAGNOSIS — Z136 Encounter for screening for cardiovascular disorders: Secondary | ICD-10-CM | POA: Diagnosis not present

## 2022-09-15 DIAGNOSIS — M7071 Other bursitis of hip, right hip: Secondary | ICD-10-CM | POA: Diagnosis not present

## 2022-09-15 DIAGNOSIS — G8929 Other chronic pain: Secondary | ICD-10-CM

## 2022-09-15 DIAGNOSIS — Z0001 Encounter for general adult medical examination with abnormal findings: Secondary | ICD-10-CM

## 2022-09-15 DIAGNOSIS — R030 Elevated blood-pressure reading, without diagnosis of hypertension: Secondary | ICD-10-CM | POA: Diagnosis not present

## 2022-09-15 DIAGNOSIS — Z13220 Encounter for screening for lipoid disorders: Secondary | ICD-10-CM | POA: Diagnosis not present

## 2022-09-15 DIAGNOSIS — M5441 Lumbago with sciatica, right side: Secondary | ICD-10-CM | POA: Diagnosis not present

## 2022-09-15 DIAGNOSIS — M5442 Lumbago with sciatica, left side: Secondary | ICD-10-CM

## 2022-09-15 LAB — CBC
HCT: 38.7 % (ref 36.0–46.0)
Hemoglobin: 12.7 g/dL (ref 12.0–15.0)
MCHC: 32.9 g/dL (ref 30.0–36.0)
MCV: 79.1 fl (ref 78.0–100.0)
Platelets: 240 10*3/uL (ref 150.0–400.0)
RBC: 4.89 Mil/uL (ref 3.87–5.11)
RDW: 15.6 % — ABNORMAL HIGH (ref 11.5–15.5)
WBC: 5.3 10*3/uL (ref 4.0–10.5)

## 2022-09-15 LAB — COMPREHENSIVE METABOLIC PANEL
ALT: 15 U/L (ref 0–35)
AST: 14 U/L (ref 0–37)
Albumin: 4 g/dL (ref 3.5–5.2)
Alkaline Phosphatase: 77 U/L (ref 39–117)
BUN: 16 mg/dL (ref 6–23)
CO2: 30 mEq/L (ref 19–32)
Calcium: 9.3 mg/dL (ref 8.4–10.5)
Chloride: 105 mEq/L (ref 96–112)
Creatinine, Ser: 0.97 mg/dL (ref 0.40–1.20)
GFR: 71.95 mL/min (ref >60.00)
Glucose, Bld: 93 mg/dL (ref 70–99)
Potassium: 4.6 mEq/L (ref 3.5–5.1)
Sodium: 140 mEq/L (ref 135–145)
Total Bilirubin: 0.3 mg/dL (ref 0.2–1.2)
Total Protein: 6.8 g/dL (ref 6.0–8.3)

## 2022-09-15 LAB — LIPID PANEL
Cholesterol: 127 mg/dL (ref 0–200)
HDL: 57.9 mg/dL (ref >39.00)
LDL Cholesterol: 61 mg/dL (ref 0–99)
NonHDL: 69.42
Total CHOL/HDL Ratio: 2
Triglycerides: 41 mg/dL (ref 0.0–149.0)
VLDL: 8.2 mg/dL (ref 0.0–40.0)

## 2022-09-15 LAB — TSH: TSH: 1.83 u[IU]/mL (ref 0.35–5.50)

## 2022-09-15 MED ORDER — PREDNISONE 20 MG PO TABS: ORAL_TABLET | ORAL | 0 refills | Status: AC

## 2022-09-15 NOTE — Assessment & Plan Note (Signed)
Improving with PT  and use of ibuprofen.

## 2022-09-15 NOTE — Patient Instructions (Signed)
Monitor BP daily in AM Call office if BP persistently > 140/80 after 1week Maintain low sodium diet and daily exercise (walking 20-78mins with stretching before and after). Call office if hip pain does not improve after 2weeks. Go to lab  DASH Eating Plan DASH stands for Dietary Approaches to Stop Hypertension. The DASH eating plan is a healthy eating plan that has been shown to: Reduce high blood pressure (hypertension). Reduce your risk for type 2 diabetes, heart disease, and stroke. Help with weight loss. What are tips for following this plan? Reading food labels Check food labels for the amount of salt (sodium) per serving. Choose foods with less than 5 percent of the Daily Value of sodium. Generally, foods with less than 300 milligrams (mg) of sodium per serving fit into this eating plan. To find whole grains, look for the word "whole" as the first word in the ingredient list. Shopping Buy products labeled as "low-sodium" or "no salt added." Buy fresh foods. Avoid canned foods and pre-made or frozen meals. Cooking Avoid adding salt when cooking. Use salt-free seasonings or herbs instead of table salt or sea salt. Check with your health care provider or pharmacist before using salt substitutes. Do not fry foods. Cook foods using healthy methods such as baking, boiling, grilling, roasting, and broiling instead. Cook with heart-healthy oils, such as olive, canola, avocado, soybean, or sunflower oil. Meal planning  Eat a balanced diet that includes: 4 or more servings of fruits and 4 or more servings of vegetables each day. Try to fill one-half of your plate with fruits and vegetables. 6-8 servings of whole grains each day. Less than 6 oz (170 g) of lean meat, poultry, or fish each day. A 3-oz (85-g) serving of meat is about the same size as a deck of cards. One egg equals 1 oz (28 g). 2-3 servings of low-fat dairy each day. One serving is 1 cup (237 mL). 1 serving of nuts, seeds, or  beans 5 times each week. 2-3 servings of heart-healthy fats. Healthy fats called omega-3 fatty acids are found in foods such as walnuts, flaxseeds, fortified milks, and eggs. These fats are also found in cold-water fish, such as sardines, salmon, and mackerel. Limit how much you eat of: Canned or prepackaged foods. Food that is high in trans fat, such as some fried foods. Food that is high in saturated fat, such as fatty meat. Desserts and other sweets, sugary drinks, and other foods with added sugar. Full-fat dairy products. Do not salt foods before eating. Do not eat more than 4 egg yolks a week. Try to eat at least 2 vegetarian meals a week. Eat more home-cooked food and less restaurant, buffet, and fast food. Lifestyle When eating at a restaurant, ask that your food be prepared with less salt or no salt, if possible. If you drink alcohol: Limit how much you use to: 0-1 drink a day for women who are not pregnant. 0-2 drinks a day for men. Be aware of how much alcohol is in your drink. In the [place] one drink equals one 12 oz bottle of beer (355 mL), one 5 oz glass of wine (148 mL), or one 1 oz glass of hard liquor (44 mL). General information Avoid eating more than 2,300 mg of salt a day. If you have hypertension, you may need to reduce your sodium intake to 1,500 mg a day. Work with your health care provider to maintain a healthy body weight or to lose weight. Ask what an  ideal weight is for you. Get at least 30 minutes of exercise that causes your heart to beat faster (aerobic exercise) most days of the week. Activities may include walking, swimming, or biking. Work with your health care provider or dietitian to adjust your eating plan to your individual calorie needs. What foods should I eat? Fruits All fresh, dried, or frozen fruit. Canned fruit in natural juice (without added sugar). Vegetables Fresh or frozen vegetables (raw, steamed, roasted, or grilled). Low-sodium or  reduced-sodium tomato and vegetable juice. Low-sodium or reduced-sodium tomato sauce and tomato paste. Low-sodium or reduced-sodium canned vegetables. Grains Whole-grain or whole-wheat bread. Whole-grain or whole-wheat pasta. Brown rice. [place]. Bulgur. Whole-grain and low-sodium cereals. Pita bread. Low-fat, low-sodium crackers. Whole-wheat flour tortillas. Meats and other proteins Skinless chicken or [place]. Ground chicken or [place]. Pork with fat trimmed off. Fish and seafood. Egg whites. Dried beans, peas, or lentils. Unsalted nuts, nut butters, and seeds. Unsalted canned beans. Lean cuts of beef with fat trimmed off. Low-sodium, lean precooked or cured meat, such as sausages or meat loaves. Dairy Low-fat (1%) or fat-free (skim) milk. Reduced-fat, low-fat, or fat-free cheeses. Nonfat, low-sodium ricotta or cottage cheese. Low-fat or nonfat yogurt. Low-fat, low-sodium cheese. Fats and oils Soft margarine without trans fats. Vegetable oil. Reduced-fat, low-fat, or light mayonnaise and salad dressings (reduced-sodium). Canola, safflower, olive, avocado, soybean, and sunflower oils. Avocado. Seasonings and condiments Herbs. Spices. Seasoning mixes without salt. Other foods Unsalted popcorn and pretzels. Fat-free sweets. The items listed above may not be a complete list of foods and beverages you can eat. Contact a dietitian for more information. What foods should I avoid? Fruits Canned fruit in a light or heavy syrup. Fried fruit. Fruit in cream or butter sauce. Vegetables Creamed or fried vegetables. Vegetables in a cheese sauce. Regular canned vegetables (not low-sodium or reduced-sodium). Regular canned tomato sauce and paste (not low-sodium or reduced-sodium). Regular tomato and vegetable juice (not low-sodium or reduced-sodium). Angie Fava. Olives. Grains Baked goods made with fat, such as croissants, muffins, or some breads. Dry pasta or rice meal packs. Meats and other  proteins Fatty cuts of meat. Ribs. Fried meat. Berniece Salines. Bologna, salami, and other precooked or cured meats, such as sausages or meat loaves. Fat from the back of a pig (fatback). Bratwurst. Salted nuts and seeds. Canned beans with added salt. Canned or smoked fish. Whole eggs or egg yolks. Chicken or [place] with skin. Dairy Whole or 2% milk, cream, and half-and-half. Whole or full-fat cream cheese. Whole-fat or sweetened yogurt. Full-fat cheese. Nondairy creamers. Whipped toppings. Processed cheese and cheese spreads. Fats and oils Butter. Stick margarine. Lard. Shortening. Ghee. Bacon fat. Tropical oils, such as coconut, palm kernel, or palm oil. Seasonings and condiments Onion salt, garlic salt, seasoned salt, table salt, and sea salt. Worcestershire sauce. Tartar sauce. Barbecue sauce. Teriyaki sauce. Soy sauce, including reduced-sodium. Steak sauce. Canned and packaged gravies. Fish sauce. Oyster sauce. Cocktail sauce. Store-bought horseradish. Ketchup. Mustard. Meat flavorings and tenderizers. Bouillon cubes. Hot sauces. Pre-made or packaged marinades. Pre-made or packaged taco seasonings. Relishes. Regular salad dressings. Other foods Salted popcorn and pretzels. The items listed above may not be a complete list of foods and beverages you should avoid. Contact a dietitian for more information. Where to find more information National Heart, Lung, and Blood Institute: https://wilson-eaton.com/ American Heart Association: www.heart.org Academy of Nutrition and Dietetics: www.eatright.[place]: www.kidney.org Summary The DASH eating plan is a healthy eating plan that has been shown to reduce high blood  pressure (hypertension). It may also reduce your risk for type 2 diabetes, heart disease, and stroke. When on the DASH eating plan, aim to eat more fresh fruits and vegetables, whole grains, lean proteins, low-fat dairy, and heart-healthy fats. With the DASH eating plan, you should  limit salt (sodium) intake to 2,300 mg a day. If you have hypertension, you may need to reduce your sodium intake to 1,500 mg a day. Work with your health care provider or dietitian to adjust your eating plan to your individual calorie needs. This information is not intended to replace advice given to you by your health care provider. Make sure you discuss any questions you have with your health care provider. Document Revised: 07/29/2019 Document Reviewed: 07/29/2019 Elsevier Patient Education  2023 Elsevier Inc.  Hip Bursitis Rehab Ask your health care provider which exercises are safe for you. Do exercises exactly as told by your health care provider and adjust them as directed. It is normal to feel mild stretching, pulling, tightness, or discomfort as you do these exercises. Stop right away if you feel sudden pain or your pain gets worse. Do not begin these exercises until told by your health care provider. Stretching exercise This exercise warms up your muscles and joints and improves the movement and flexibility of your hip. This exercise also helps to relieve pain and stiffness. Iliotibial band stretch An iliotibial band is a strong band of muscle tissue that runs from the outer side of your hip to the outer side of your thigh and knee. Lie on your side with your left / right leg in the top position. Bend your left / right knee and grab your ankle. Stretch out your bottom arm to help you balance. Slowly bring your knee back so your thigh is slightly behind your body. Slowly lower your knee toward the floor until you feel a gentle stretch on the outside of your left / right thigh. If you do not feel a stretch and your knee will not lower more toward the floor, place the heel of your other foot on top of your knee and pull your knee down toward the floor with your foot. Hold this position for __________ seconds. Slowly return to the starting position. Repeat __________ times. Complete this  exercise __________ times a day. Strengthening exercises These exercises build strength and endurance in your hip and pelvis. Endurance is the ability to use your muscles for a long time, even after they get tired. Bridge This exercise strengthens the muscles that move your thigh backward (hip extensors). Lie on your back on a firm surface with your knees bent and your feet flat on the floor. Tighten your buttocks muscles and lift your buttocks off the floor until your trunk is level with your thighs. Do not arch your back. You should feel the muscles working in your buttocks and the back of your thighs. If you do not feel these muscles, slide your feet 1-2 inches (2.5-5 cm) farther away from your buttocks. If this exercise is too easy, try doing it with your arms crossed over your chest. Hold this position for __________ seconds. Slowly lower your hips to the starting position. Let your muscles relax completely after each repetition. Repeat __________ times. Complete this exercise __________ times a day. Squats This exercise strengthens the muscles in front of your thigh and knee (quadriceps). Stand in front of a table, with your feet and knees pointing straight ahead. You may rest your hands on the table for balance  but not for support. Slowly bend your knees and lower your hips like you are going to sit in a chair. Keep your weight over your heels, not over your toes. Keep your lower legs upright so they are parallel with the table legs. Do not let your hips go lower than your knees. Do not bend lower than told by your health care provider. If your hip pain increases, do not bend as low. Hold the squat position for __________ seconds. Slowly push with your legs to return to standing. Do not use your hands to pull yourself to standing. Repeat __________ times. Complete this exercise __________ times a day. Hip hike  Stand sideways on a bottom step. Stand on your left / right leg with  your other foot unsupported next to the step. You can hold on to the railing or wall for balance if needed. Keep your knees straight and your torso square. Then lift your left / right hip up toward the ceiling. Hold this position for __________ seconds. Slowly let your left / right hip lower toward the floor, past the starting position. Your foot should get closer to the floor. Do not lean or bend your knees. Repeat __________ times. Complete this exercise __________ times a day. Single leg stand This exercise increases your balance. Without shoes, stand near a railing or in a doorway. You may hold on to the railing or door frame as needed for balance. Squeeze your left / right buttock muscles, then lift up your other foot. Do not let your left / right hip push out to the side. It is helpful to stand in front of a mirror for this exercise so you can watch your hip. Hold this position for __________ seconds. Repeat __________ times. Complete this exercise __________ times a day. This information is not intended to replace advice given to you by your health care provider. Make sure you discuss any questions you have with your health care provider. Document Revised: 08/07/2021 Document Reviewed: 08/07/2021 Elsevier Patient Education  [place].

## 2022-09-15 NOTE — Assessment & Plan Note (Signed)
Worse with prolonged standing and internal hip rotation. No improvement with ibuprofen No paresthesia, no weakness.  Provided oral prednisone dose pack and hip exercises F/up in 40month.

## 2022-09-15 NOTE — Progress Notes (Signed)
Complete physical exam  Patient: Paula Rojas   DOB: 1981/09/03   42 y.o. Female  MRN: [id number] Visit Date: 09/15/2022  Subjective:    Chief Complaint  Patient presents with   Annual Exam    CPE Pt fasting      Paula Rojas is a 42 y.o. female who presents today for a complete physical exam. She reports consuming a general diet.  No exercise regimen  She generally feels well. She reports sleeping well. She does have additional problems to discuss today.  Vision:No Dental:No STD Screen:No  Wt Readings from Last 3 Encounters:  09/15/22 249 lb 9.6 oz (113.2 kg)  07/14/22 251 lb 3.2 oz (113.9 kg)  04/17/15 231 lb (104.8 kg)    Most recent fall risk assessment:     No data to display           Depression screen:Yes - No Depression  Most recent depression screenings:    09/15/2022   10:02 AM 07/14/2022    2:23 PM  PHQ 2/9 Scores  PHQ - 2 Score 0 1  PHQ- 9 Score 1 2    HPI  Chronic bilateral low back pain with bilateral sciatica Improving with PT  and use of ibuprofen.  Iliopsoas bursitis of right hip Worse with prolonged standing and internal hip rotation. No improvement with ibuprofen No paresthesia, no weakness.  Provided oral prednisone dose pack and hip exercises F/up in 70month.  Elevated BP without diagnosis of hypertension BP Readings from Last 3 Encounters:  09/15/22 (!) 132/100  07/14/22 130/80  04/17/15 126/79    Monitor BP daily in AM Call office if BP persistently > 140/80 after 1week Maintain low sodium diet and daily exercise (walking 20-63mins with stretching before and after).   History reviewed. No pertinent past medical history. Past Surgical History:  Procedure Laterality Date   CESAREAN SECTION     CESAREAN SECTION  02/27/2012   Procedure: CESAREAN SECTION;  Surgeon: Serita Kyle, MD;  Location: [facility];  Service: Gynecology;  Laterality: N/A;   Social History   Socioeconomic History   Marital status: Married    Spouse  name: Not on file   Number of children: Not on file   Years of education: Not on file   Highest education level: Not on file  Occupational History   Not on file  Tobacco Use   Smoking status: Never   Smokeless tobacco: Never  Vaping Use   Vaping Use: Never used  Substance and Sexual Activity   Alcohol use: No   Drug use: No   Sexual activity: Yes    Birth control/protection: None  Other Topics Concern   Not on file  Social History Narrative   Not on file   Social Determinants of Health   Financial Resource Strain: Not on file  Food Insecurity: Not on file  Transportation Needs: Not on file  Physical Activity: Not on file  Stress: Not on file  Social Connections: Not on file  Intimate Partner Violence: Not on file   Family Status  Relation Name Status   Mother  Alive   Father  Alive   Sister  Alive   Family History  Problem Relation Age of Onset   Breast cancer Mother 104       negative BRCA test   Hypertension Mother    Diabetes Mother    Prostate cancer Father    COPD Sister    No Known Allergies  Patient Care Team: Alysia Penna  Lum, NP as PCP - General (Internal Medicine)   Medications: Outpatient Medications Prior to Visit  Medication Sig   ibuprofen (ADVIL) 800 MG tablet Take 1 tablet (800 mg total) by mouth daily as needed. With food   No facility-administered medications prior to visit.    Review of Systems  Constitutional:  Negative for fever.  HENT:  Negative for congestion and sore throat.   Eyes:        Negative for visual changes  Respiratory:  Negative for cough and shortness of breath.   Cardiovascular:  Negative for chest pain, palpitations and leg swelling.  Gastrointestinal:  Negative for blood in stool, constipation and diarrhea.  Genitourinary:  Negative for dysuria, frequency and urgency.  Musculoskeletal:  Positive for arthralgias and back pain. Negative for gait problem and myalgias.  Skin:  Negative for rash.  Neurological:   Negative for dizziness and headaches.  Hematological:  Does not bruise/bleed easily.  Psychiatric/Behavioral:  Negative for suicidal ideas. The patient is not nervous/anxious.         Objective:  BP (!) 132/100   Pulse 69   Temp (!) 96.9 F (36.1 C) (Temporal)   Ht 5' (1.524 m)   Wt 249 lb 9.6 oz (113.2 kg)   LMP 08/28/2022 (Exact Date)   SpO2 96%   BMI 48.75 kg/m     Physical Exam Vitals and nursing note reviewed.  Constitutional:      General: She is not in acute distress.    Appearance: She is well-developed. She is obese.  HENT:     Right Ear: Tympanic membrane, ear canal and external ear normal.     Left Ear: Tympanic membrane, ear canal and external ear normal.     Nose: Nose normal.     Mouth/Throat:     Pharynx: No oropharyngeal exudate.  Eyes:     Extraocular Movements: Extraocular movements intact.     Conjunctiva/sclera: Conjunctivae normal.     Pupils: Pupils are equal, round, and reactive to light.  Cardiovascular:     Rate and Rhythm: Normal rate and regular rhythm.     Pulses: Normal pulses.     Heart sounds: Normal heart sounds.  Pulmonary:     Effort: Pulmonary effort is normal. No respiratory distress.     Breath sounds: Normal breath sounds.  Chest:     Chest wall: No tenderness.  Abdominal:     General: Bowel sounds are normal.     Palpations: Abdomen is soft.  Musculoskeletal:        General: Tenderness present. No swelling, deformity or signs of injury. Normal range of motion.     Cervical back: Normal range of motion and neck supple.     Right hip: Tenderness present. No bony tenderness or crepitus. Normal range of motion. Normal strength.     Left hip: Normal.     Right upper leg: Normal.     Left upper leg: Normal.     Right knee: Normal.     Left knee: Normal.     Right lower leg: No edema.     Left lower leg: No edema.  Lymphadenopathy:     Cervical: No cervical adenopathy.  Skin:    General: Skin is warm and dry.  Neurological:      Mental Status: She is alert and oriented to person, place, and time.     Deep Tendon Reflexes: Reflexes are normal and symmetric.  Psychiatric:        Mood and  Affect: Mood normal.        Behavior: Behavior normal.        Thought Content: Thought content normal.      No results found for any visits on 09/15/22.    Assessment & Plan:    Routine Health Maintenance and Physical Exam  Immunization History  Administered Date(s) Administered   Tdap 02/28/2012   Health Maintenance  Topic Date Due   Hepatitis C Screening  Never done   COVID-19 Vaccine (1) 10/01/2022 (Originally 06/13/1981)   INFLUENZA VACCINE  12/07/2022 (Originally 04/08/2022)   PAP SMEAR-Modifier  07/29/2024   HIV Screening  Completed   HPV VACCINES  Aged Out   DTaP/Tdap/Td  Discontinued   Discussed health benefits of physical activity, and encouraged her to engage in regular exercise appropriate for her age and condition.  Problem List Items Addressed This Visit       Nervous and Auditory   Chronic bilateral low back pain with bilateral sciatica    Improving with PT  and use of ibuprofen.      Relevant Medications   predniSONE (DELTASONE) 20 MG tablet     Musculoskeletal and Integument   Iliopsoas bursitis of right hip    Worse with prolonged standing and internal hip rotation. No improvement with ibuprofen No paresthesia, no weakness.  Provided oral prednisone dose pack and hip exercises F/up in 19month.      Relevant Medications   predniSONE (DELTASONE) 20 MG tablet     Other   Elevated BP without diagnosis of hypertension    BP Readings from Last 3 Encounters:  09/15/22 (!) 132/100  07/14/22 130/80  04/17/15 126/79    Monitor BP daily in AM Call office if BP persistently > 140/80 after 1week Maintain low sodium diet and daily exercise (walking 20-62mins with stretching before and after).      Other Visit Diagnoses     Encounter for preventative adult health care exam with abnormal  findings    -  Primary   Relevant Orders   Lipid panel   Comprehensive metabolic panel   CBC   TSH   Encounter for lipid screening for cardiovascular disease       Relevant Orders   Lipid panel      Return in about 4 weeks (around 10/13/2022) for elevated BP, hip pain.     Alysia Penna, NP

## 2022-09-15 NOTE — Assessment & Plan Note (Signed)
BP Readings from Last 3 Encounters:  09/15/22 (!) 132/100  07/14/22 130/80  04/17/15 126/79    Monitor BP daily in AM Call office if BP persistently > 140/80 after 1week Maintain low sodium diet and daily exercise (walking 20-50mins with stretching before and after).

## 2022-10-13 ENCOUNTER — Encounter: Payer: Self-pay | Admitting: Nurse Practitioner

## 2022-10-13 ENCOUNTER — Ambulatory Visit (INDEPENDENT_AMBULATORY_CARE_PROVIDER_SITE_OTHER): Payer: BC Managed Care – PPO | Admitting: Nurse Practitioner

## 2022-10-13 VITALS — BP 128/84 | HR 79 | Temp 97.6°F | Wt 250.2 lb

## 2022-10-13 DIAGNOSIS — R030 Elevated blood-pressure reading, without diagnosis of hypertension: Secondary | ICD-10-CM | POA: Diagnosis not present

## 2022-10-13 DIAGNOSIS — M7071 Other bursitis of hip, right hip: Secondary | ICD-10-CM

## 2022-10-13 NOTE — Assessment & Plan Note (Signed)
Improved with DASH diet BP Readings from Last 3 Encounters:  10/13/22 128/84  09/15/22 (!) 132/100  07/14/22 130/80    Advised to continue BP monitoring at home 2-3x/week in AM Call office if BP >140/90. Maintain DASH diet and daily exercise

## 2022-10-13 NOTE — Patient Instructions (Signed)
Omron or Relion BP Machine  Continue to monitor BP 2x/week in Am Call office if BP >140/90 Maintain DASH diet and daily exercise

## 2022-10-13 NOTE — Progress Notes (Signed)
                Established Patient Visit  Patient: Paula Rojas   DOB: 01/20/1981   42 y.o. Female  MRN: [id number] Visit Date: 10/13/2022  Subjective:    Chief Complaint  Patient presents with   Follow-up    Right Hip pain and b/p follow up. Fasting. Right Hip pain has gotten better.    HPI Iliopsoas bursitis of right hip improved  Elevated BP without diagnosis of hypertension Improved with DASH diet BP Readings from Last 3 Encounters:  10/13/22 128/84  09/15/22 (!) 132/100  07/14/22 130/80    Advised to continue BP monitoring at home 2-3x/week in AM Call office if BP >140/90. Maintain DASH diet and daily exercise   Wt Readings from Last 3 Encounters:  10/13/22 250 lb 3.2 oz (113.5 kg)  09/15/22 249 lb 9.6 oz (113.2 kg)  07/14/22 251 lb 3.2 oz (113.9 kg)    Reviewed medical, surgical, and social history today  Medications: Outpatient Medications Prior to Visit  Medication Sig   ibuprofen (ADVIL) 800 MG tablet Take 1 tablet (800 mg total) by mouth daily as needed. With food   No facility-administered medications prior to visit.   Reviewed past medical and social history.   ROS per HPI above      Objective:  BP 128/84 (BP Location: Left Arm, Patient Position: Sitting, Cuff Size: Large)   Pulse 79   Temp 97.6 F (36.4 C) (Temporal)   Wt 250 lb 3.2 oz (113.5 kg)   LMP 09/28/2022 (Exact Date)   SpO2 99%   BMI 48.86 kg/m      Physical Exam Vitals reviewed.  Cardiovascular:     Rate and Rhythm: Normal rate.     Pulses: Normal pulses.  Pulmonary:     Effort: Pulmonary effort is normal.  Musculoskeletal:     Right lower leg: No edema.     Left lower leg: No edema.  Neurological:     Mental Status: She is alert and oriented to person, place, and time.     No results found for any visits on 10/13/22.    Assessment & Plan:    Problem List Items Addressed This Visit       Musculoskeletal and Integument   Iliopsoas bursitis of right hip    improved         Other   Elevated BP without diagnosis of hypertension - Primary    Improved with DASH diet BP Readings from Last 3 Encounters:  10/13/22 128/84  09/15/22 (!) 132/100  07/14/22 130/80    Advised to continue BP monitoring at home 2-3x/week in AM Call office if BP >140/90. Maintain DASH diet and daily exercise      Return in about 11 months (around 09/16/2023) for CPE (fasting).     Wilfred Lacy, NP

## 2022-10-13 NOTE — Assessment & Plan Note (Addendum)
Improved Continue home exercises and use of NSAID as needed for pain

## 2022-11-07 LAB — HM MAMMOGRAPHY

## 2023-09-16 ENCOUNTER — Encounter: Payer: BC Managed Care – PPO | Admitting: Nurse Practitioner

## 2023-09-17 LAB — BASIC METABOLIC PANEL WITH GFR
BUN: 14 (ref 4–21)
CO2: 22 (ref 13–22)
Chloride: 102 (ref 99–108)
Creatinine: 0.9 (ref 0.5–1.1)
Glucose: 70
Sodium: 141 (ref 137–147)

## 2023-09-17 LAB — CBC AND DIFFERENTIAL
HCT: 42 (ref 36–46)
Hemoglobin: 13.6 (ref 12.0–16.0)
Platelets: 285 K/uL (ref 150–400)
WBC: 4.3

## 2023-09-17 LAB — LIPID PANEL
Cholesterol: 135 (ref 0–200)
HDL: 68 (ref 35–70)
LDL Cholesterol: 57
Triglycerides: 44 (ref 40–160)

## 2023-09-17 LAB — HIV ANTIBODY (ROUTINE TESTING W REFLEX): HIV 1&2 Ab, 4th Generation: NONREACTIVE

## 2023-09-17 LAB — COMPREHENSIVE METABOLIC PANEL WITH GFR
Albumin: 4.3 (ref 3.5–5.0)
Calcium: 9.2 (ref 8.7–10.7)
Globulin: 2.6

## 2023-09-17 LAB — HEPATIC FUNCTION PANEL
ALT: 19 U/L (ref 7–35)
AST: 14 (ref 13–35)
Alkaline Phosphatase: 94 (ref 25–125)
Bilirubin, Total: 0.3

## 2023-09-17 LAB — CBC: RBC: 5.22 — AB (ref 3.87–5.11)

## 2023-09-17 LAB — HEMOGLOBIN A1C: Hemoglobin A1C: 5.3

## 2023-09-17 LAB — HEPATITIS B SURFACE ANTIGEN: Hepatitis B Surface Ag: NEGATIVE

## 2023-09-18 LAB — LAB REPORT - SCANNED: HM Hepatitis Screen: NEGATIVE

## 2023-09-21 LAB — RESULTS CONSOLE HPV: CHL HPV: NEGATIVE

## 2023-10-14 ENCOUNTER — Ambulatory Visit (INDEPENDENT_AMBULATORY_CARE_PROVIDER_SITE_OTHER): Payer: BC Managed Care – PPO | Admitting: Nurse Practitioner

## 2023-10-14 ENCOUNTER — Encounter: Payer: Self-pay | Admitting: Nurse Practitioner

## 2023-10-14 VITALS — BP 118/82 | HR 62 | Temp 98.0°F | Ht 60.0 in | Wt 246.8 lb

## 2023-10-14 DIAGNOSIS — E010 Iodine-deficiency related diffuse (endemic) goiter: Secondary | ICD-10-CM | POA: Diagnosis not present

## 2023-10-14 DIAGNOSIS — Z8632 Personal history of gestational diabetes: Secondary | ICD-10-CM | POA: Diagnosis not present

## 2023-10-14 DIAGNOSIS — R5383 Other fatigue: Secondary | ICD-10-CM | POA: Diagnosis not present

## 2023-10-14 DIAGNOSIS — Z0001 Encounter for general adult medical examination with abnormal findings: Secondary | ICD-10-CM | POA: Diagnosis not present

## 2023-10-14 DIAGNOSIS — E041 Nontoxic single thyroid nodule: Secondary | ICD-10-CM | POA: Diagnosis not present

## 2023-10-14 DIAGNOSIS — M79674 Pain in right toe(s): Secondary | ICD-10-CM | POA: Diagnosis not present

## 2023-10-14 LAB — COMPREHENSIVE METABOLIC PANEL
ALT: 15 U/L (ref 0–35)
AST: 14 U/L (ref 0–37)
Albumin: 4.1 g/dL (ref 3.5–5.2)
Alkaline Phosphatase: 85 U/L (ref 39–117)
BUN: 11 mg/dL (ref 6–23)
CO2: 28 meq/L (ref 19–32)
Calcium: 9.2 mg/dL (ref 8.4–10.5)
Chloride: 103 meq/L (ref 96–112)
Creatinine, Ser: 0.83 mg/dL (ref 0.40–1.20)
GFR: 86.09 mL/min (ref >60.00)
Glucose, Bld: 78 mg/dL (ref 70–99)
Potassium: 3.9 meq/L (ref 3.5–5.1)
Sodium: 138 meq/L (ref 135–145)
Total Bilirubin: 0.4 mg/dL (ref 0.2–1.2)
Total Protein: 7.5 g/dL (ref 6.0–8.3)

## 2023-10-14 LAB — CBC
HCT: 39.6 % (ref 36.0–46.0)
Hemoglobin: 12.8 g/dL (ref 12.0–15.0)
MCHC: 32.4 g/dL (ref 30.0–36.0)
MCV: 80.1 fL (ref 78.0–100.0)
Platelets: 268 10*3/uL (ref 150.0–400.0)
RBC: 4.94 Mil/uL (ref 3.87–5.11)
RDW: 15.8 % — ABNORMAL HIGH (ref 11.5–15.5)
WBC: 4.7 10*3/uL (ref 4.0–10.5)

## 2023-10-14 LAB — IBC + FERRITIN
Ferritin: 11.6 ng/mL (ref 10.0–291.0)
Iron: 55 ug/dL (ref 42–145)
Saturation Ratios: 13.1 % — ABNORMAL LOW (ref 20.0–50.0)
TIBC: 420 ug/dL (ref 250.0–450.0)
Transferrin: 300 mg/dL (ref 212.0–360.0)

## 2023-10-14 LAB — URIC ACID: Uric Acid, Serum: 5.5 mg/dL (ref 2.4–7.0)

## 2023-10-14 LAB — HEMOGLOBIN A1C: Hgb A1c MFr Bld: 5.3 % (ref 4.6–6.5)

## 2023-10-14 NOTE — Patient Instructions (Signed)
 Go to lab Maintain Heart healthy diet and daily exercise. Ok to use ibuprofen  400mg  every 8hrs or naproxen 500mg  every 12hrs as needed for toe pain. Avoid narrow shoes  Preventive Care 78-44 Years Old, Female Preventive care refers to lifestyle choices and visits with your health care provider that can promote health and wellness. Preventive care visits are also called wellness exams. What can I expect for my preventive care visit? Counseling Your health care provider may ask you questions about your: Medical history, including: Past medical problems. Family medical history. Pregnancy history. Current health, including: Menstrual cycle. Method of birth control. Emotional well-being. Home life and relationship well-being. Sexual activity and sexual health. Lifestyle, including: Alcohol, nicotine or tobacco, and drug use. Access to firearms. Diet, exercise, and sleep habits. Work and work astronomer. Sunscreen use. Safety issues such as seatbelt and bike helmet use. Physical exam Your health care provider will check your: Height and weight. These may be used to calculate your BMI (body mass index). BMI is a measurement that tells if you are at a healthy weight. Waist circumference. This measures the distance around your waistline. This measurement also tells if you are at a healthy weight and may help predict your risk of certain diseases, such as type 2 diabetes and high blood pressure. Heart rate and blood pressure. Body temperature. Skin for abnormal spots. What immunizations do I need?  Vaccines are usually given at various ages, according to a schedule. Your health care provider will recommend vaccines for you based on your age, medical history, and lifestyle or other factors, such as travel or where you work. What tests do I need? Screening Your health care provider may recommend screening tests for certain conditions. This may include: Lipid and cholesterol  levels. Diabetes screening. This is done by checking your blood sugar (glucose) after you have not eaten for a while (fasting). Pelvic exam and Pap test. Hepatitis B test. Hepatitis C test. HIV (human immunodeficiency virus) test. STI (sexually transmitted infection) testing, if you are at risk. Lung cancer screening. Colorectal cancer screening. Mammogram. Talk with your health care provider about when you should start having regular mammograms. This may depend on whether you have a family history of breast cancer. BRCA-related cancer screening. This may be done if you have a family history of breast, ovarian, tubal, or peritoneal cancers. Bone density scan. This is done to screen for osteoporosis. Talk with your health care provider about your test results, treatment options, and if necessary, the need for more tests. Follow these instructions at home: Eating and drinking  Eat a diet that includes fresh fruits and vegetables, whole grains, lean protein, and low-fat dairy products. Take vitamin and mineral supplements as recommended by your health care provider. Do not drink alcohol if: Your health care provider tells you not to drink. You are pregnant, may be pregnant, or are planning to become pregnant. If you drink alcohol: Limit how much you have to 0-1 drink a day. Know how much alcohol is in your drink. In the [place] one drink equals one 12 oz bottle of beer (355 mL), one 5 oz glass of wine (148 mL), or one 1 oz glass of hard liquor (44 mL). Lifestyle Brush your teeth every morning and night with fluoride toothpaste. Floss one time each day. Exercise for at least 30 minutes 5 or more days each week. Do not use any products that contain nicotine or tobacco. These products include cigarettes, chewing tobacco, and vaping devices, such as e-cigarettes.  If you need help quitting, ask your health care provider. Do not use drugs. If you are sexually active, practice safe sex. Use a  condom or other form of protection to prevent STIs. If you do not wish to become pregnant, use a form of birth control. If you plan to become pregnant, see your health care provider for a prepregnancy visit. Take aspirin only as told by your health care provider. Make sure that you understand how much to take and what form to take. Work with your health care provider to find out whether it is safe and beneficial for you to take aspirin daily. Find healthy ways to manage stress, such as: Meditation, yoga, or listening to music. Journaling. Talking to a trusted person. Spending time with friends and family. Minimize exposure to UV radiation to reduce your risk of skin cancer. Safety Always wear your seat belt while driving or riding in a vehicle. Do not drive: If you have been drinking alcohol. Do not ride with someone who has been drinking. When you are tired or distracted. While texting. If you have been using any mind-altering substances or drugs. Wear a helmet and other protective equipment during sports activities. If you have firearms in your house, make sure you follow all gun safety procedures. Seek help if you have been physically or sexually abused. What's next? Visit your health care provider once a year for an annual wellness visit. Ask your health care provider how often you should have your eyes and teeth checked. Stay up to date on all vaccines. This information is not intended to replace advice given to you by your health care provider. Make sure you discuss any questions you have with your health care provider. Document Revised: 02/20/2021 Document Reviewed: 02/20/2021 Elsevier Patient Education  2024 [employer].

## 2023-10-14 NOTE — Assessment & Plan Note (Signed)
 Right great toe pain x 58month, intermittent, pain with palpation and weight bearing activity. Denies any injury  Possible arthritis vs gout? Advised to avoid narrow shoes, and use NSAIDs prn Check uric acid and CMP

## 2023-10-14 NOTE — Assessment & Plan Note (Signed)
 L>R Incidental finding on exam Non tender. No hoarseness or dysphagia.  Get thyroid  US , THYROID  panel and antibody panel.

## 2023-10-14 NOTE — Progress Notes (Signed)
 Complete physical exam  Patient: Paula Rojas   DOB: 1980-09-22   43 y.o. Female  MRN: [id number] Visit Date: 10/14/2023  Subjective:    Chief Complaint  Patient presents with   Annual Exam    With fasting lab work, concerns with occasional right great toe pain, fatigue alot   Paula Rojas is a 43 y.o. female who presents today for a complete physical exam. She reports consuming a general diet.  No exercise  She generally feels well. She reports sleeping well. She does have additional problems to discuss today.  Vision:No Dental:No STD Screen:No  BP Readings from Last 3 Encounters:  10/14/23 118/82  10/13/22 128/84  09/15/22 (!) 132/100   Wt Readings from Last 3 Encounters:  10/14/23 246 lb 12.8 oz (111.9 kg)  10/13/22 250 lb 3.2 oz (113.5 kg)  09/15/22 249 lb 9.6 oz (113.2 kg)   Most recent fall risk assessment:    10/14/2023    1:13 PM  Fall Risk   Falls in the past year? 1  Number falls in past yr: 0  Injury with Fall? 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 No hoarseness or dysphagia.  Get thyroid  US , THYROID  panel and antibody panel.  Great toe pain, right Right great toe pain x 59month, intermittent, pain with palpation and weight bearing activity. Denies any injury  Possible arthritis vs gout? Advised to avoid narrow shoes, and use NSAIDs prn Check uric acid and CMP   No past medical history on file. Past Surgical History:  Procedure Laterality Date   CESAREAN SECTION     CESAREAN SECTION  02/27/2012   Procedure: CESAREAN SECTION;  Surgeon: Dickie DELENA Carder, MD;  Location: [facility];  Service: Gynecology;  Laterality: N/A;   Social History   Socioeconomic History    Marital status: Married    Spouse name: Not on file   Number of children: Not on file   Years of education: Not on file   Highest education level: Bachelor's degree (e.g., BA, AB, BS)  Occupational History   Not on file  Tobacco Use   Smoking status: Never   Smokeless tobacco: Never  Vaping Use   Vaping status: Never Used  Substance and Sexual Activity   Alcohol use: No   Drug use: No   Sexual activity: Yes    Birth control/protection: None  Other Topics Concern   Not on file  Social History Narrative   Not on file   Social Drivers of Health   Financial Resource Strain: Low Risk  (10/10/2023)   Overall Financial Resource Strain (CARDIA)    Difficulty of Paying Living Expenses: Not hard at all  Food Insecurity: No Food Insecurity (10/10/2023)   Hunger Vital Sign    Worried About Running Out of Food in the Last Year: Never true    Ran Out of Food in the Last Year: Never true  Transportation Needs: No Transportation Needs (10/10/2023)   PRAPARE - Administrator, Civil Service (Medical): No    Lack of Transportation (Non-Medical): No  Physical Activity: Unknown (10/10/2023)   Exercise Vital Sign  Days of Exercise per Week: 0 days    Minutes of Exercise per Session: Not on file  Stress: No Stress Concern Present (10/10/2023)   [employer] of Occupational Health - Occupational Stress Questionnaire    Feeling of Stress : Not at all  Social Connections: Moderately Isolated (10/10/2023)   Social Connection and Isolation Panel [NHANES]    Frequency of Communication with Friends and Family: More than three times a week    Frequency of Social Gatherings with Friends and Family: Once a week    Attends Religious Services: Never    Database Administrator or Organizations: No    Attends Engineer, Structural: Not on file    Marital Status: Married  Catering Manager Violence: Not on file   Family Status  Relation Name Status   Mother  Alive   Father  Alive    Sister  Alive  No partnership data on file   Family History  Problem Relation Age of Onset   Breast cancer Mother 25       negative BRCA test   Hypertension Mother    Diabetes Mother    Prostate cancer Father    COPD Sister    No Known Allergies  Patient Care Team: Karmello Abercrombie, Roselie Rockford, NP as PCP - General (Internal Medicine) Rutherford Gain, MD as Consulting Physician (Obstetrics and Gynecology)   Medications: Outpatient Medications Prior to Visit  Medication Sig   ibuprofen  (ADVIL ) 800 MG tablet Take 1 tablet (800 mg total) by mouth daily as needed. With food (Patient not taking: Reported on 10/14/2023)   No facility-administered medications prior to visit.    Review of Systems  Constitutional:  Positive for fatigue. Negative for activity change, appetite change and unexpected weight change.  Respiratory: Negative.    Cardiovascular: Negative.   Gastrointestinal: Negative.   Endocrine: Negative for cold intolerance and heat intolerance.  Genitourinary: Negative.   Musculoskeletal:  Positive for arthralgias.  Skin: Negative.   Neurological: Negative.   Hematological: Negative.   Psychiatric/Behavioral:  Negative for behavioral problems, decreased concentration, dysphoric mood, hallucinations, self-injury, sleep disturbance and suicidal ideas. The patient is not nervous/anxious.        Objective:  BP 118/82 (BP Location: Right Arm, Patient Position: Sitting)   Pulse 62   Temp 98 F (36.7 C)   Ht 5' (1.524 m)   Wt 246 lb 12.8 oz (111.9 kg)   LMP 09/26/2023 (Exact Date)   SpO2 99%   BMI 48.20 kg/m     Physical Exam Vitals and nursing note reviewed.  Constitutional:      General: She is not in acute distress. HENT:     Right Ear: Tympanic membrane, ear canal and external ear normal.     Left Ear: Tympanic membrane, ear canal and external ear normal.     Nose: Nose normal.  Eyes:     Extraocular Movements: Extraocular movements intact.      Conjunctiva/sclera: Conjunctivae normal.     Pupils: Pupils are equal, round, and reactive to light.  Neck:     Thyroid : No thyroid  mass, thyromegaly or thyroid  tenderness.  Cardiovascular:     Rate and Rhythm: Normal rate and regular rhythm.     Pulses: Normal pulses.     Heart sounds: Normal heart sounds.  Pulmonary:     Effort: Pulmonary effort is normal.     Breath sounds: Normal breath sounds.  Abdominal:     General: Bowel sounds are normal.  Palpations: Abdomen is soft.  Musculoskeletal:        General: Normal range of motion.     Cervical back: Normal range of motion and neck supple.     Right lower leg: No edema.     Left lower leg: No edema.  Lymphadenopathy:     Cervical: No cervical adenopathy.  Skin:    General: Skin is warm and dry.  Neurological:     Mental Status: She is alert and oriented to person, place, and time.     Cranial Nerves: No cranial nerve deficit.  Psychiatric:        Mood and Affect: Mood normal.        Behavior: Behavior normal.        Thought Content: Thought content normal.      No results found for any visits on 10/14/23.    Assessment & Plan:    Routine Health Maintenance and Physical Exam  Immunization History  Administered Date(s) Administered   Tdap 02/28/2012    Health Maintenance  Topic Date Due   Hepatitis C Screening  Never done   COVID-19 Vaccine (1 - 2024-25 season) 10/30/2023 (Originally 05/10/2023)   INFLUENZA VACCINE  12/07/2023 (Originally 04/09/2023)   Cervical Cancer Screening (HPV/Pap Cotest)  09/12/2025   HIV Screening  Completed   HPV VACCINES  Aged Out   DTaP/Tdap/Td  Discontinued   Discussed health benefits of physical activity, and encouraged her to engage in regular exercise appropriate for her age and condition.  Problem List Items Addressed This Visit     Great toe pain, right   Right great toe pain x 16month, intermittent, pain with palpation and weight bearing activity. Denies any  injury  Possible arthritis vs gout? Advised to avoid narrow shoes, and use NSAIDs prn Check uric acid and CMP      Relevant Orders   Uric acid   History of gestational diabetes   Relevant Orders   Hemoglobin A1c   Other fatigue   Relevant Orders   CBC   IBC + Ferritin   Thyromegaly   L>R Incidental finding on exam Non tender. No hoarseness or dysphagia.  Get thyroid  US , THYROID  panel and antibody panel.      Relevant Orders   Thyroid  Panel With TSH   Thyroglobulin antibody   Thyroid  peroxidase antibody   US  THYROID    Other Visit Diagnoses       Encounter for preventative adult health care exam with abnormal findings    -  Primary   Relevant Orders   Comprehensive metabolic panel      Return in about 1 year (around 10/13/2024) for CPE (fasting).     Roselie Mood, NP

## 2023-10-15 ENCOUNTER — Ambulatory Visit
Admission: RE | Admit: 2023-10-15 | Discharge: 2023-10-15 | Disposition: A | Discharge status: Home / Self Care | Payer: BC Managed Care – PPO | Source: Ambulatory Visit | Attending: Nurse Practitioner | Admitting: Nurse Practitioner

## 2023-10-15 DIAGNOSIS — E010 Iodine-deficiency related diffuse (endemic) goiter: Secondary | ICD-10-CM

## 2023-10-15 LAB — THYROGLOBULIN ANTIBODY: Thyroglobulin Ab: <1 [IU]/mL (ref <=1)

## 2023-10-15 LAB — THYROID PANEL WITH TSH
Free Thyroxine Index: 1.7 (ref 1.4–3.8)
T3 Uptake: 33 % (ref 22–35)
T4, Total: 5.3 ug/dL (ref 5.1–11.9)
TSH: 2.28 m[IU]/L

## 2023-10-15 LAB — THYROID PEROXIDASE ANTIBODY: Thyroperoxidase Ab SerPl-aCnc: <1 [IU]/mL (ref <9)

## 2023-10-19 ENCOUNTER — Encounter: Payer: Self-pay | Admitting: Nurse Practitioner

## 2023-10-21 ENCOUNTER — Encounter: Payer: Self-pay | Admitting: Nurse Practitioner

## 2023-10-21 NOTE — Addendum Note (Signed)
Addended by: Alysia Penna L on: 10/21/2023 01:20 PM   Modules accepted: Orders

## 2023-10-27 ENCOUNTER — Ambulatory Visit
Admission: RE | Admit: 2023-10-27 | Discharge: 2023-10-27 | Disposition: A | Discharge status: Home / Self Care | Payer: BC Managed Care – PPO | Source: Ambulatory Visit | Attending: Nurse Practitioner | Admitting: Nurse Practitioner

## 2023-10-27 ENCOUNTER — Other Ambulatory Visit (HOSPITAL_COMMUNITY)
Admission: RE | Admit: 2023-10-27 | Discharge: 2023-10-27 | Disposition: A | Discharge status: Home / Self Care | Payer: BC Managed Care – PPO | Source: Ambulatory Visit | Attending: Diagnostic Radiology | Admitting: Diagnostic Radiology

## 2023-10-27 DIAGNOSIS — E010 Iodine-deficiency related diffuse (endemic) goiter: Secondary | ICD-10-CM

## 2023-10-27 DIAGNOSIS — E041 Nontoxic single thyroid nodule: Secondary | ICD-10-CM

## 2023-10-28 LAB — CYTOLOGY - NON PAP

## 2023-10-30 ENCOUNTER — Encounter: Payer: Self-pay | Admitting: Nurse Practitioner

## 2023-11-13 LAB — HM PAP SMEAR

## 2023-11-13 LAB — HM MAMMOGRAPHY

## 2024-03-08 NOTE — Progress Notes (Unsigned)
 Office Visit Note  Patient: Paula Rojas             Date of Birth: Jul 19, 1981           MRN: [id number]             PCP: Katheen Roselie Rockford, NP Referring: Rutherford Gain, MD Visit Date: 03/10/2024 Occupation: @GUAROCC @  Subjective:  Pain in multiple joints   History of Present Illness: Paula Rojas is a 43 y.o. female seen for the evaluation of positive SSA antibody and polyarthralgia.  According the patient in 2017 she injured her lower back and started having right-sided sciatica.  She was seen by an orthopedic surgeon and had injection in her lower back followed by physical therapy which helped.  She states the pain recurred few months later and starts radiating into her hips.  She states the pain radiates in both hips.  She recently was referred to physical therapy again by her PCP which helped for short time.  She continues to have some stiffness in her neck especially in the morning.  The lower back pain persists.  She also complains of discomfort in her elbows off and on.  She notices stiffness and swelling in her hands in the morning.  She describes discomfort over her DIP joints.  She has been also having pain and discomfort in her right great toe bunion for the last 8 months.  She has not seen a podiatrist.  She had some recent labs by by her PCP which showed positive SSA antibody.  She denies any history of oral ulcers, nasal ulcers, sicca symptoms, photosensitivity, Raynaud's, lymphadenopathy, rash. 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  She enjoys traveling, crafts and reading.  She also walks for exercise.  She is right-handed, married, gravida 2, para 2.  She is not using any contraceptives.  She does not drink any alcohol and is never been a smoker.  There is no family history of autoimmune disease.    Activities of Daily Living:  Patient reports morning stiffness for 1-2 hours.   Patient Denies nocturnal pain.  Difficulty dressing/grooming: Denies Difficulty climbing  stairs: Denies Difficulty getting out of chair: Denies Difficulty using hands for taps, buttons, cutlery, and/or writing: Denies  Review of Systems  Constitutional:  Positive for fatigue.  HENT:  Negative for mouth sores and mouth dryness.   Eyes:  Negative for dryness.  Respiratory:  Negative for shortness of breath.   Cardiovascular:  Negative for chest pain and palpitations.  Gastrointestinal:  Positive for constipation. Negative for blood in stool and diarrhea.  Endocrine: Positive for increased urination.  Genitourinary:  Negative for involuntary urination.  Musculoskeletal:  Positive for joint pain, joint pain, joint swelling, myalgias, muscle weakness, morning stiffness, muscle tenderness and myalgias. Negative for gait problem.  Skin:  Positive for rash. Negative for color change, hair loss and sensitivity to sunlight.  Allergic/Immunologic: Negative for susceptible to infections.  Neurological:  Negative for dizziness and headaches.  Hematological:  Negative for swollen glands.  Psychiatric/Behavioral:  Negative for depressed mood and sleep disturbance. The patient is not nervous/anxious.     PMFS History:  Patient Active Problem List   Diagnosis Date Noted   Thyromegaly 10/14/2023   Great toe pain, right 10/14/2023   Other fatigue 10/14/2023   Iliopsoas bursitis of right hip 09/15/2022   Elevated BP without diagnosis of hypertension 09/15/2022   Chronic bilateral low back pain with bilateral sciatica 07/14/2022   History of herniated intervertebral disc  07/14/2022   Amenorrhea 07/14/2022   Constipation, chronic 07/14/2022   History of gestational diabetes 12/02/2011    History reviewed. No pertinent past medical history.  Family History  Problem Relation Age of Onset   Breast cancer Mother 2       negative BRCA test   Hypertension Mother    Diabetes Mother    Prostate cancer Father    COPD Sister    Past Surgical History:  Procedure Laterality Date   CESAREAN  SECTION     CESAREAN SECTION  02/27/2012   Procedure: CESAREAN SECTION;  Surgeon: Dickie DELENA Carder, MD;  Location: [facility];  Service: Gynecology;  Laterality: N/A;   Social History   Social History Narrative   Not on file   Immunization History  Administered Date(s) Administered   Tdap 02/28/2012     Objective: Vital Signs: BP (!) 153/101 (BP Location: Right Arm, Patient Position: Sitting, Cuff Size: Large)   Pulse 73   Resp 14   Ht 5' 0.5 (1.537 m)   Wt 247 lb (112 kg)   LMP 02/26/2024   BMI 47.44 kg/m    Physical Exam Vitals and nursing note reviewed.  Constitutional:      Appearance: She is well-developed.  HENT:     Head: Normocephalic and atraumatic.  Eyes:     Conjunctiva/sclera: Conjunctivae normal.  Cardiovascular:     Rate and Rhythm: Normal rate and regular rhythm.     Heart sounds: Normal heart sounds.  Pulmonary:     Effort: Pulmonary effort is normal.     Breath sounds: Normal breath sounds.  Abdominal:     General: Bowel sounds are normal.     Palpations: Abdomen is soft.  Musculoskeletal:     Cervical back: Normal range of motion.  Lymphadenopathy:     Cervical: No cervical adenopathy.  Skin:    General: Skin is warm and dry.     Capillary Refill: Capillary refill takes less than 2 seconds.  Neurological:     Mental Status: She is alert and oriented to person, place, and time.  Psychiatric:        Behavior: Behavior normal.      Musculoskeletal Exam: Cervical, thoracic and lumbar spine were in good range of motion.  She had some tenderness in the lower lumbar region.  She had no difficulty reaching her toes.  Shoulders, elbows, wrist joints, MCPs PIPs and DIPs with good range of motion with no synovitis.  She had tenderness over left lateral epicondyle region.  Hip joints were in good range of motion with some discomfort over trochanteric bursa.  Knee joints were in good range of motion without any warmth swelling or effusion.  There was no  tenderness over ankles or MTPs.  She has thickening of the right first MTP joint with no synovitis.  CDAI Exam: CDAI Score: -- Patient Global: --; Provider Global: -- Swollen: --; Tender: -- Joint Exam 03/10/2024   No joint exam has been documented for this visit   There is currently no information documented on the homunculus. Go to the Rheumatology activity and complete the homunculus joint exam.  Investigation: No additional findings.  Imaging: No results found.  Recent Labs: Lab Results  Component Value Date   WBC 4.7 10/14/2023   HGB 12.8 10/14/2023   PLT 268.0 10/14/2023   NA 138 10/14/2023   K 3.9 10/14/2023   CL 103 10/14/2023   CO2 28 10/14/2023   GLUCOSE 78 10/14/2023   BUN 11  10/14/2023   CREATININE 0.83 10/14/2023   BILITOT 0.4 10/14/2023   ALKPHOS 85 10/14/2023   AST 14 10/14/2023   ALT 15 10/14/2023   PROT 7.5 10/14/2023   ALBUMIN 4.1 10/14/2023   CALCIUM 9.2 10/14/2023   September 17, 2023 SSA 2.0, SSB negative, dsDNA negative, RNP negative, Smith negative, SCL 70 negative, antichromatin negative, anticentromere negative, Jo 1 negative, RF negative  Speciality Comments: No specialty comments available.  Procedures:  No procedures performed Allergies: Patient has no known allergies.   Assessment / Plan:     Visit Diagnoses: Polyarthralgia - SSA positive, RF negative -she complains of pain and discomfort in multiple joints.  No synovitis was noted on the examination.  She had positive SSA antibody.  She denies any history of sicca symptoms.  Association of SSA antibodies with palpitations was discussed.  She was advised to get a baseline EKG.  Association of SSA antibody with fetal cardiac conduction abnormalities was discussed.  She is not using any contraception.  Association of SSA antibodies with rash, Sjogren's, ILD and lymphoma was discussed.  Will repeat labs in 6 months.  Use of sunscreen was advised.  Plan: ANA, Sjogrens syndrome-A extractable  nuclear antibody, Anti-DNA antibody, double-stranded, C3 and C4  Pain in both hands-she complains of stiffness in her hands.  She works as HR and is on computer for long hours.  No synovitis or tenderness was noted on the examination today.  Neck pain-gives history of neck stiffness especially in the morning.  A handout on neck exercises was given.  She denies any radiculopathy.  Lateral epicondylitis, left elbow-she had tenderness over left lateral epicondyle region most likely related to her computer work.  A handout on forearm exercises was given.  Great toe pain, right-she complains of discomfort over her right great toe.  Clinical findings were suggestive of bunion.  Patient declined x-rays of her foot.  Proper fitting shoes with provide toe box and arch support was discussed.  Lumbar spondylosis -she continues to have some lower back discomfort.  July 17, 2022 x-rays showed spurring at L5-S1.  She has excessive lumbar lordosis.  She has done physical therapy which was helpful.  I advised her to continue with the stretching exercises.  Other medical problems are listed as follows:  Other fatigue  Thyromegaly  Constipation, chronic  Orders: Orders Placed This Encounter  Procedures   ANA   Sjogrens syndrome-A extractable nuclear antibody   Anti-DNA antibody, double-stranded   C3 and C4   No orders of the defined types were placed in this encounter.    Follow-Up Instructions: Return in about 6 months (around 09/10/2024) for Positive SSA, OA.   Maya Nash, MD  Note - This record has been created using Animal nutritionist.  Chart creation errors have been sought, but may not always  have been located. Such creation errors do not reflect on  the standard of medical care.

## 2024-03-10 ENCOUNTER — Encounter: Payer: Self-pay | Admitting: Rheumatology

## 2024-03-10 ENCOUNTER — Ambulatory Visit: Attending: Rheumatology | Admitting: Rheumatology

## 2024-03-10 VITALS — BP 131/89 | HR 65 | Resp 14 | Ht 60.5 in | Wt 247.0 lb

## 2024-03-10 DIAGNOSIS — M79641 Pain in right hand: Secondary | ICD-10-CM

## 2024-03-10 DIAGNOSIS — M542 Cervicalgia: Secondary | ICD-10-CM

## 2024-03-10 DIAGNOSIS — M79642 Pain in left hand: Secondary | ICD-10-CM

## 2024-03-10 DIAGNOSIS — M7712 Lateral epicondylitis, left elbow: Secondary | ICD-10-CM

## 2024-03-10 DIAGNOSIS — R5383 Other fatigue: Secondary | ICD-10-CM

## 2024-03-10 DIAGNOSIS — M47816 Spondylosis without myelopathy or radiculopathy, lumbar region: Secondary | ICD-10-CM

## 2024-03-10 DIAGNOSIS — E010 Iodine-deficiency related diffuse (endemic) goiter: Secondary | ICD-10-CM

## 2024-03-10 DIAGNOSIS — M79674 Pain in right toe(s): Secondary | ICD-10-CM

## 2024-03-10 DIAGNOSIS — M2550 Pain in unspecified joint: Secondary | ICD-10-CM

## 2024-03-10 DIAGNOSIS — K5909 Other constipation: Secondary | ICD-10-CM

## 2024-03-10 NOTE — Patient Instructions (Signed)
 Hand Exercises Hand exercises can be helpful for almost anyone. They can strengthen your hands and improve flexibility and movement. The exercises can also increase blood flow to the hands. These results can make your work and daily tasks easier for you. Hand exercises can be especially helpful for people who have joint pain from arthritis or nerve damage from using their hands over and over. These exercises can also help people who injure a hand. Exercises Most of these hand exercises are gentle stretching and motion exercises. It is usually safe to do them often throughout the day. Warming up your hands before exercise may help reduce stiffness. You can do this with gentle massage or by placing your hands in warm water for 10-15 minutes. It is normal to feel some stretching, pulling, tightness, or mild discomfort when you begin new exercises. In time, this will improve. Remember to always be careful and stop right away if you feel sudden, very bad pain or your pain gets worse. You want to get better and be safe. Ask your health care provider which exercises are safe for you. Do exercises exactly as told by your provider and adjust them as told. Do not begin these exercises until told by your provider. Knuckle bend or claw fist  Stand or sit with your arm, hand, and all five fingers pointed straight up. Make sure to keep your wrist straight. Gently bend your fingers down toward your palm until the tips of your fingers are touching your palm. Keep your big knuckle straight and only bend the small knuckles in your fingers. Hold this position for 10 seconds. Straighten your fingers back to your starting position. Repeat this exercise 5-10 times with each hand. Full finger fist  Stand or sit with your arm, hand, and all five fingers pointed straight up. Make sure to keep your wrist straight. Gently bend your fingers into your palm until the tips of your fingers are touching the middle of your  palm. Hold this position for 10 seconds. Extend your fingers back to your starting position, stretching every joint fully. Repeat this exercise 5-10 times with each hand. Straight fist  Stand or sit with your arm, hand, and all five fingers pointed straight up. Make sure to keep your wrist straight. Gently bend your fingers at the big knuckle, where your fingers meet your hand, and at the middle knuckle. Keep the knuckle at the tips of your fingers straight and try to touch the bottom of your palm. Hold this position for 10 seconds. Extend your fingers back to your starting position, stretching every joint fully. Repeat this exercise 5-10 times with each hand. Tabletop  Stand or sit with your arm, hand, and all five fingers pointed straight up. Make sure to keep your wrist straight. Gently bend your fingers at the big knuckle, where your fingers meet your hand, as far down as you can. Keep the small knuckles in your fingers straight. Think of forming a tabletop with your fingers. Hold this position for 10 seconds. Extend your fingers back to your starting position, stretching every joint fully. Repeat this exercise 5-10 times with each hand. Finger spread  Place your hand flat on a table with your palm facing down. Make sure your wrist stays straight. Spread your fingers and thumb apart from each other as far as you can until you feel a gentle stretch. Hold this position for 10 seconds. Bring your fingers and thumb tight together again. Hold this position for 10 seconds. Repeat  this exercise 5-10 times with each hand. Making circles  Stand or sit with your arm, hand, and all five fingers pointed straight up. Make sure to keep your wrist straight. Make a circle by touching the tip of your thumb to the tip of your index finger. Hold for 10 seconds. Then open your hand wide. Repeat this motion with your thumb and each of your fingers. Repeat this exercise 5-10 times with each hand. Thumb  motion  Sit with your forearm resting on a table and your wrist straight. Your thumb should be facing up toward the ceiling. Keep your fingers relaxed as you move your thumb. Lift your thumb up as high as you can toward the ceiling. Hold for 10 seconds. Bend your thumb across your palm as far as you can, reaching the tip of your thumb for the small finger (pinkie) side of your palm. Hold for 10 seconds. Repeat this exercise 5-10 times with each hand. Grip strengthening  Hold a stress ball or other soft ball in the middle of your hand. Slowly increase the pressure, squeezing the ball as much as you can without causing pain. Think of bringing the tips of your fingers into the middle of your palm. All of your finger joints should bend when doing this exercise. Hold your squeeze for 10 seconds, then relax. Repeat this exercise 5-10 times with each hand. Contact a health care provider if: Your hand pain or discomfort gets much worse when you do an exercise. Your hand pain or discomfort does not improve within 2 hours after you exercise. If you have either of these problems, stop doing these exercises right away. Do not do them again unless your provider says that you can. Get help right away if: You develop sudden, severe hand pain or swelling. If this happens, stop doing these exercises right away. Do not do them again unless your provider says that you can. This information is not intended to replace advice given to you by your health care provider. Make sure you discuss any questions you have with your health care provider. Document Revised: 09/09/2022 Document Reviewed: 09/09/2022 Elsevier Patient Education  2024 [employer] Cervical Strain and Sprain Rehab Ask your health care provider which exercises are safe for you. Do exercises exactly as told by your health care provider and adjust them as directed. It is normal to feel mild stretching, pulling, tightness, or discomfort as you do these  exercises. Stop right away if you feel sudden pain or your pain gets worse. Do not begin these exercises until told by your health care provider. Stretching and range-of-motion exercises Cervical side bending  Using good posture, sit on a stable chair or stand up. Without moving your shoulders, slowly tilt your left / right ear to your shoulder until you feel a stretch in the neck muscles on the opposite side. You should be looking straight ahead. Hold for __________ seconds. Repeat with the other side of your neck. Repeat __________ times. Complete this exercise __________ times a day. Cervical rotation  Using good posture, sit on a stable chair or stand up. Slowly turn your head to the side as if you are looking over your left / right shoulder. Keep your eyes level with the ground. Stop when you feel a stretch along the side and the back of your neck. Hold for __________ seconds. Repeat this by turning to your other side. Repeat __________ times. Complete this exercise __________ times a day. Thoracic extension and pectoral stretch  Roll a towel or a small blanket so it is about 4 inches (10 cm) in diameter. Lie down on your back on a firm surface. Put the towel in the middle of your back across your spine. It should not be under your shoulder blades. Put your hands behind your head and let your elbows fall out to your sides. Hold for __________ seconds. Repeat __________ times. Complete this exercise __________ times a day. Strengthening exercises Upper cervical flexion  Lie on your back with a thin pillow behind your head or a small, rolled-up towel under your neck. Gently tuck your chin toward your chest and nod your head down to look toward your feet. Do not lift your head off the pillow. Hold for __________ seconds. Release the tension slowly. Relax your neck muscles completely before you repeat this exercise. Repeat __________ times. Complete this exercise __________ times a  day. Cervical extension  Stand about 6 inches (15 cm) away from a wall, with your back facing the wall. Place a soft object, about 6-8 inches (15-20 cm) in diameter, between the back of your head and the wall. A soft object could be a small pillow, a ball, or a folded towel. Gently tilt your head back and press into the soft object. Keep your jaw and forehead relaxed. Hold for __________ seconds. Release the tension slowly. Relax your neck muscles completely before you repeat this exercise. Repeat __________ times. Complete this exercise __________ times a day. Posture and body mechanics Body mechanics refer to the movements and positions of your body while you do your daily activities. Posture is part of body mechanics. Good posture and healthy body mechanics can help to relieve stress in your body's tissues and joints. Good posture means that your spine is in its natural S-curve position (your spine is neutral), your shoulders are pulled back slightly, and your head is not tipped forward. The following are general guidelines for using improved posture and body mechanics in your everyday activities. Sitting  When sitting, keep your spine neutral and keep your feet flat on the floor. Use a footrest, if needed, and keep your thighs parallel to the floor. Avoid rounding your shoulders. Avoid tilting your head forward. When working at a desk or a computer, keep your desk at a height where your hands are slightly lower than your elbows. Slide your chair under your desk so you are close enough to maintain good posture. When working at a computer, place your monitor at a height where you are looking straight ahead and you do not have to tilt your head forward or downward to look at the screen. Standing  When standing, keep your spine neutral and keep your feet about hip-width apart. Keep a slight bend in your knees. Your ears, shoulders, and hips should line up. When you do a task in which you stand in  one place for a long time, place one foot up on a stable object that is 2-4 inches (5-10 cm) high, such as a footstool. This helps keep your spine neutral. Resting When lying down and resting, avoid positions that are most painful for you. Try to support your neck in a neutral position. You can use a contour pillow or a small rolled-up towel. Your pillow should support your neck but not push on it. This information is not intended to replace advice given to you by your health care provider. Make sure you discuss any questions you have with your health care provider. Document Revised: 12/29/2022  Document Reviewed: 03/17/2022 Elsevier Patient Education  2024 [employer] Exercises for Elbow and Forearm Elbow and forearm exercises can help you get better after an injury or health problem. Only do the exercises you were told to do. Make sure you know how to do the exercises safely. Follow the steps below. It's normal to feel mild discomfort. Stop if you feel pain or your pain gets worse. Do not start these exercises until told by your health care provider. Range-of-motion exercises These exercises warm up your muscles and joints. They can help your elbow and forearm move better and be more flexible. These exercises are done using the muscles in your injured elbow and forearm. Elbow flexion, active  Hold your left / right arm at your side. Bend your elbow as far as you can using only your arm muscles. Hold this position for __________ seconds. Slowly go back to the starting position. Repeat __________ times. Do this exercise __________ times a day. Elbow extension, active  Hold your left / right arm at your side. Straighten your elbow as much as you can using only your arm muscles. Hold this position for __________ seconds. Slowly go back to the starting position. Repeat __________ times. Do this exercise __________ times a day. Forearm rotation, supination This is an exercise in which you turn  your forearm palm-up. Stand or sit with your elbows at your sides. Bend your left / right elbow to a 90-degree angle (right angle). Position your forearm so that your thumb faces the ceiling. Turn your palm up toward the ceiling until you feel a gentle stretch on the inside of your forearm. If told, use your other hand to help turn your forearm farther until you feel a gentle to moderate stretch. Hold this position for __________ seconds. Slowly go back to the starting position. Repeat __________ times. Do this exercise __________ times a day. Forearm rotation, pronation This is an exercise in which you turn your forearm palm-down. Stand or sit with your elbows at your sides. Bend your left / right elbow to a 90-degree angle. Position your forearm so that your thumb faces the ceiling. Turn your palm down until you feel a gentle stretch on the top of your forearm. If told, use your other hand to help turn your forearm farther until you feel a gentle to moderate stretch. Hold this position for __________ seconds. Slowly go back to the starting position. Repeat __________ times. Do this exercise __________ times a day. Stretching exercises These exercises warm up your muscles and joints. They're done using your healthy elbow and forearm to help stretch the muscles in your injured elbow and forearm. Elbow flexion, active-assisted  Hold your left / right arm at your side. Bend your elbow as much as you can using your left / right arm muscles. Use your other hand to bend your left / right elbow farther. Gently push up on your forearm until you feel a gentle stretch on the back of your elbow. Hold this position for __________ seconds. Slowly go back to the starting position. Repeat __________ times. Do this exercise __________ times a day. Elbow extension, active-assisted  Hold your left / right arm at your side. Straighten your elbow as much as you can using your left / right arm muscles. Use  your other hand to straighten the left / right elbow farther. Gently push down on your forearm until you feel a gentle stretch on the inside of your elbow. Hold this position for __________ seconds.  Slowly go back to the starting position. Repeat __________ times. Do this exercise __________ times a day. Passive elbow flexion, supine  Lie on your back. Lift your left / right arm up in the air, bracing it with your other hand. Let your left / right hand slowly lower toward your shoulder. Keep your elbow pointed toward the ceiling. You should feel a gentle stretch along the back of your upper arm and elbow. If told, you may add a small wrist weight or hand weight to increase the stretch. Hold this position for __________ seconds. Slowly go back to the starting position. Repeat __________ times. Do this exercise __________ times a day. Passive elbow extension, supine  Lie on your back. Make sure you're comfortable and can relax your arm muscles. Place a folded towel under your left / right upper arm so your elbow and shoulder are at the same height. Straighten your left / right arm so your elbow doesn't rest on the bed or towel. Let the weight of your hand stretch your elbow. Keep your arm and chest muscles relaxed. You should feel a stretch on the inside of your elbow. If told, you may add a small wrist weight or hand weight to increase the stretch. Hold this position for __________ seconds. Slowly release the stretch. Repeat __________ times. Do this exercise __________ times a day. Strengthening exercises These exercises build strength and endurance in your elbow and forearm. Endurance is the ability to use your muscles for a long time, even after they get tired. Elbow flexion, isometric  Stand or sit up straight. Bend your left / right elbow in a 90-degree angle. Keep your forearm at the height of your waist. Your thumb should point toward the ceiling. Place your other hand on top of  your left / right forearm. Gently push down while you resist with your left / right arm. Push as hard as you can with both arms without causing any pain or movement at your left / right elbow. Hold this position for __________ seconds. Slowly release the tension in both arms. Let your muscles fully relax. Repeat __________ times. Do this exercise __________ times a day. Elbow extension, isometric  Stand or sit up straight. Place your left / right arm so your palm faces your belly and is at the height of your waist. Place your other hand on the underside of your left / right forearm. Gently push up while you resist with your left / right arm. Push as hard as you can with both arms without causing any pain or movement at your left / right elbow. Hold this position for __________ seconds. Slowly release the tension in both arms. Let your muscles fully relax. Repeat __________ times. Do this exercise __________ times a day. Elbow flexion with forearm palm up  Sit on a firm chair without armrests or stand up. Place your left / right arm at your side with your elbow straight and your palm facing forward. Holding a __________ weight or gripping a rubber exercise band or tubing, bend your elbow to bring your hand toward your shoulder. Hold this position for __________ seconds. Slowly go back to the starting position. Repeat __________ times. Do this exercise __________ times a day. Elbow extension, active  Sit on a firm chair without armrests or stand up. Hold a rubber exercise band or tubing in both hands. Keep your upper arms at your sides. Bring both hands up to your left / right shoulder. Keep your left /  right hand just below your other hand. Straighten your left / right elbow. Keep your other arm still. Hold this position for __________ seconds. Control the resistance of the band or tubing as you go back to the starting position. Repeat __________ times. Do this exercise __________ times a  day. Forearm rotation with weight, supination  Sit with your left / right forearm supported on a table. Your elbow should be at waist height and bent at a 90-degree angle. Gently grasp a lightweight hammer. Rest your hand over the edge of the table with your palm facing down. Without moving your left / right elbow, slowly turn your forearm to turn your palm up toward the ceiling. Hold this position for __________ seconds. Slowly go back to the starting position. Repeat __________ times. Do this exercise __________ times a day. Forearm rotation with weight, pronation  Sit with your left / right forearm supported on a table. Keep your elbow below shoulder height. Gently grasp a lightweight hammer. Rest your hand over the edge of the table with your palm facing up. Without moving your left / right elbow, slowly turn your forearm to turn your palm down toward the floor. Hold this position for __________ seconds. Slowly go back to the starting position. Repeat __________ times. Do this exercise __________ times a day. This information is not intended to replace advice given to you by your health care provider. Make sure you discuss any questions you have with your health care provider. Document Revised: 03/19/2023 Document Reviewed: 03/19/2023 Elsevier Patient Education  2024 [employer].

## 2024-03-18 ENCOUNTER — Telehealth: Payer: Self-pay | Admitting: Nurse Practitioner

## 2024-03-18 NOTE — Telephone Encounter (Signed)
 Copied from CRM [id number]. Topic: Appointments - Scheduling Inquiry for Clinic >> Mar 18, 2024 12:45 PM Geroldine GRADE wrote: Reason for RMF:Paula Rojas is calling because she stated she received her script for an EKG, so she is looking to get scheduled

## 2024-03-21 NOTE — Telephone Encounter (Signed)
 Called patient and informed her that we received her message about an EKG script. I informed her that I was calling to get more information at the order and who may have ordered it. Patient stated that her Rheumatologist told her to contact her PCP for an EKG  for her SSA. Upon trying to schedule for a nurse visit, Roselie asked that I place patient on hold. Informed by Roselie patient needs a office visit with her not a nurse visit. Upon connecting back with patient she informed me that Dr. Rutherford referred her to rheumatology due to her labs and Dr. Dolphus told her to get her PCP to do an EKG to monitor her heart. Asked patient if she can send the labs from Dr. Rutherford via MyChart or bring them in at her now scheduled appointment with [facility] tomorrow 03/22/24 at 1:40 PM. Patient verbalized understanding and thanked me for getting back with her. All question (if any) were answered.

## 2024-03-22 ENCOUNTER — Encounter: Payer: Self-pay | Admitting: Nurse Practitioner

## 2024-03-22 ENCOUNTER — Ambulatory Visit: Admitting: Nurse Practitioner

## 2024-03-22 VITALS — BP 132/84 | HR 74 | Temp 97.6°F | Ht 60.0 in | Wt 247.2 lb

## 2024-03-22 DIAGNOSIS — R768 Other specified abnormal immunological findings in serum: Secondary | ICD-10-CM | POA: Insufficient documentation

## 2024-03-22 NOTE — Assessment & Plan Note (Addendum)
 Positive ANA and SSA at 2.0 per labs completed by GYN in January. She had appointment with rheumatology on 03/10/2024, ECG recommended due to risk of arrhythmia and prolonged Q-T interval. Today she denies any palpitations or CP or syncopal episodes.  ECG completed today: NSR, normal QT, PR and QRS, non specific T wave inversion in lead III only. No previous ECG to compare. Advised to continue f/up with rheumatology

## 2024-03-22 NOTE — Patient Instructions (Addendum)
 Send lab results from Dr. Rutherford via mychart Normal intervals on ECG. F/up with rheumatology as recommended  Sjogren's Syndrome Sjgren's syndrome is an inflammatory disease in which the body's disease-fighting system (immune system) attacks the glands that produce tears (lacrimal glands) and the glands that produce saliva (salivary glands). This makes the eyes and mouth very dry. Sjgren's syndrome can also affect other parts of the body, causing dryness of the skin, nose, throat, and vagina. Sjgren's syndrome is a long-term (chronic) disorder that has no cure. In some cases, it is linked to other disorders (rheumatic disorders), such as rheumatoid arthritis and systemic lupus erythematosus (SLE). It may affect other parts of the body, such as the: Blood vessels. Joints. Lungs. Kidneys. Liver or pancreas. Brain, nerves, or spinal cord. What are the causes? The cause of this condition is not known. It may be passed along from parent to child (inherited), or it may be a symptom of a rheumatic disorder. What increases the risk? This condition is more likely to develop in: Women. People who are 87-6 years old and older. People who have recently had a viral infection or currently have a viral infection. What are the signs or symptoms? The main symptoms of this condition are: Dry mouth. This may include: A chalky feeling. Difficulty swallowing, speaking, or tasting. Frequent cavities in the teeth. Frequent mouth infections. Dry eyes. This may include: Burning, redness, and itching. Blurry vision. Fluctuating vision. Light sensitivity. Other symptoms may include: Dryness of the skin and the inside of the nose. Eyelid infections. Vaginal dryness (if applicable). Joint pain and stiffness. Muscle pain and stiffness. How is this diagnosed? This condition is diagnosed based on: Your symptoms. Your medical history. A physical exam of your eyes and mouth. Tests, including: A  Schirmer test. This tests your tear production. An eye exam that is done with a magnifying device (slit-lamp exam). An eye test that temporarily stains your eye with special dyes. This shows the extent of eye damage. Tests to check your salivary gland function. Biopsy. This is a removal of part of a salivary gland from inside your lower lip to be studied under a microscope. Chest X-rays. Blood or urine tests. How is this treated? There is no cure for this condition, but treatment can help you manage your symptoms. You may be asked to see a rheumatologist for further evaluation and treatment. This condition may be treated with: Medicines to help relieve pain and stiffness. Medicines to help relieve inflammation in your body (corticosteroids). These are usually for severe cases. Medicines to help reduce the activity of your immune system (immunosuppressants). These are usually prescribed by your health care provider or a rheumatologist. Moisture replacement therapies to help relieve dryness in your skin, mouth, and eyes. Dry eyes may be treated with: Eye drops or nasal sprays to improve dryness of the eyes. Surgery or insertion of plugs to close the lacrimal glands (punctal occlusion). This helps keep more natural tears in your eyes. Soft contact lenses or hard scleral lenses. These are occasionally used to protect the surface of the eye. Biologic lubricating eye drops (serum tears). These are eye drops made from a person's own blood. They are used in some people with severe dry eye. Follow these instructions at home: Eye care  Use eye drops and other medicines as told by your health care provider. Protect your eyes from the sun and wind with sunglasses or glasses. Blink at least 5-6 times a minute. Maintain properly humidified air. You may want  to use a humidifier at home and at work. Avoid smoke. Mouth care Brush your teeth and floss after every meal. Chew sugar-free gum or suck on hard  candy. This may help to relieve dry mouth. Use antimicrobial mouthwash daily. Take frequent sips of water or sugar-free drinks. Use saliva substitutes or lip balm as told by your health care provider. See your dentist every 6 months. General instructions  Take over-the-counter and prescription medicines only as told by your health care provider. Drink enough fluid to keep your urine pale yellow. Keep all follow-up visits. This is important. Contact a health care provider if: You have a fever. You have night sweats. You are always tired. You have unexplained weight loss. You develop itchy skin. You have red patches on your skin. You have a lump or swelling on your neck. Get help right away if: You develop severe eye pain. You develop sudden decreased vision. Summary Sjgren's syndrome is a disease in which the body's immune system attacks the glands that produce tears and the glands that produce saliva. This condition makes the eyes and mouth very dry. Sjgren's syndrome is a long-term (chronic) disorder. There is no cure for this condition, but treatment can help you manage your symptoms. The cause of this condition is not known. You may be asked to see a rheumatologist for further evaluation and treatment. This information is not intended to replace advice given to you by your health care provider. Make sure you discuss any questions you have with your health care provider. Document Revised: 03/25/2021 Document Reviewed: 03/25/2021 Elsevier Patient Education  2024 [employer].

## 2024-03-22 NOTE — Telephone Encounter (Signed)
 Printed and given to provider to review. Patient has an office visit with provider scheduled for today

## 2024-03-22 NOTE — Progress Notes (Signed)
                Established Patient Visit  Patient: Paula Rojas   DOB: 1981/01/27   43 y.o. Female  MRN: [id number] Visit Date: 03/22/2024  Subjective:    Chief Complaint  Patient presents with   Follow-up    Following up for labs and EKG requested by Rheumatology    HPI SS-A antibody positive Positive ANA and SSA at 2.0 per labs completed by GYN in January. She had appointment with rheumatology on 03/10/2024, ECG recommended due to risk of arrhythmia and prolonged Q-T interval. Today she denies any palpitations or CP or syncopal episodes.  ECG completed today: NSR, normal QT, PR and QRS, non specific T wave inversion in lead III only. No previous ECG to compare. Advised to continue f/up with rheumatology  Reviewed medical, surgical, and social history today  Medications: Outpatient Medications Prior to Visit  Medication Sig   ibuprofen  (ADVIL ) 800 MG tablet Take 1 tablet (800 mg total) by mouth daily as needed. With food   No facility-administered medications prior to visit.   Reviewed past medical and social history.   ROS per HPI above      Objective:  BP 132/84 (BP Location: Left Arm, Patient Position: Sitting, Cuff Size: Large)   Pulse 74   Temp 97.6 F (36.4 C) (Oral)   Ht 5' (1.524 m)   Wt 247 lb 3.2 oz (112.1 kg)   LMP 02/26/2024   SpO2 99%   BMI 48.28 kg/m      Physical Exam Neck:     Thyroid : Thyromegaly present. No thyroid  mass or thyroid  tenderness.  Cardiovascular:     Rate and Rhythm: Normal rate and regular rhythm.     Pulses: Normal pulses.     Heart sounds: Normal heart sounds.  Pulmonary:     Effort: Pulmonary effort is normal.     Breath sounds: Normal breath sounds.  Lymphadenopathy:     Cervical: No cervical adenopathy.  Neurological:     Mental Status: She is alert and oriented to person, place, and time.     No results found for any visits on 03/22/24.    Assessment & Plan:    Problem List Items Addressed This Visit     SS-A  antibody positive - Primary   Positive ANA and SSA at 2.0 per labs completed by GYN in January. She had appointment with rheumatology on 03/10/2024, ECG recommended due to risk of arrhythmia and prolonged Q-T interval. Today she denies any palpitations or CP or syncopal episodes.  ECG completed today: NSR, normal QT, PR and QRS, non specific T wave inversion in lead III only. No previous ECG to compare. Advised to continue f/up with rheumatology      Relevant Orders   EKG 12-Lead   Return if symptoms worsen or fail to improve.     Roselie Mood, NP

## 2024-03-23 ENCOUNTER — Encounter: Payer: Self-pay | Admitting: Nurse Practitioner

## 2024-03-23 NOTE — Telephone Encounter (Signed)
 Labs abstracted into patient chart

## 2024-04-11 ENCOUNTER — Encounter: Payer: BC Managed Care – PPO | Admitting: Rheumatology

## 2024-05-19 ENCOUNTER — Ambulatory Visit: Payer: BC Managed Care – PPO | Admitting: Rheumatology

## 2024-08-30 NOTE — Progress Notes (Signed)
 "  Office Visit Note  Patient: Paula Rojas             Date of Birth: [birth year]           MRN: [id number]             PCP: Katheen Roselie Rockford, NP Referring: Katheen Roselie Rockford, NP Visit Date: 09/13/2024 Occupation: H[employer]  Subjective:  Pain in joints and abnormal labs  History of Present Illness: Paula Rojas is a 43 y.o. female with positive ANA, positive SSA and polyarthralgia.  She returns today after her last visit in July 2025.  She denies any history of dry mouth or dry eyes.  There is no history of shortness of breath or palpitations.  She continues to have some stiffness in her hands,  neck and lower back.  She states the discomfort in her elbow and foot has resolved.  She does not have much discomfort in her hands.  She denies any history of joint swelling.  There is no history of rash.    Activities of Daily Living:  Patient reports morning stiffness for 10 minutes.   Patient Denies nocturnal pain.  Difficulty dressing/grooming: Denies Difficulty climbing stairs: Denies Difficulty getting out of chair: Denies Difficulty using hands for taps, buttons, cutlery, and/or writing: Denies  Review of Systems  Constitutional:  Negative for fatigue.  HENT:  Negative for mouth sores and mouth dryness.   Eyes:  Negative for dryness.  Respiratory:  Negative for shortness of breath.   Cardiovascular:  Negative for chest pain and palpitations.  Gastrointestinal:  Negative for blood in stool, constipation and diarrhea.  Endocrine: Negative for increased urination.  Genitourinary:  Negative for involuntary urination.  Musculoskeletal:  Positive for joint pain, joint pain and morning stiffness. Negative for gait problem, joint swelling, myalgias, muscle weakness, muscle tenderness and myalgias.  Skin:  Negative for color change, rash, hair loss and sensitivity to sunlight.  Allergic/Immunologic: Negative for susceptible to infections.  Neurological:  Negative for dizziness and  headaches.  Hematological:  Negative for swollen glands.  Psychiatric/Behavioral:  Negative for depressed mood and sleep disturbance. The patient is not nervous/anxious.     PMFS History:  Patient Active Problem List   Diagnosis Date Noted   Strain of flexor muscle of right hip 09/06/2024   SS-A antibody positive 03/22/2024   Thyromegaly 10/14/2023   Great toe pain, right 10/14/2023   Other fatigue 10/14/2023   Iliopsoas bursitis of right hip 09/15/2022   Elevated BP without diagnosis of hypertension 09/15/2022   Chronic bilateral low back pain with bilateral sciatica 07/14/2022   History of herniated intervertebral disc 07/14/2022   Amenorrhea 07/14/2022   Constipation, chronic 07/14/2022   History of gestational diabetes 12/02/2011    History reviewed. No pertinent past medical history.  Family History  Problem Relation Age of Onset   Breast cancer Mother 58       negative BRCA test   Hypertension Mother    Diabetes Mother    Prostate cancer Father    COPD Sister    Past Surgical History:  Procedure Laterality Date   CESAREAN SECTION     CESAREAN SECTION  02/27/2012   Procedure: CESAREAN SECTION;  Surgeon: Dickie DELENA Carder, MD;  Location: [facility];  Service: Gynecology;  Laterality: N/A;   Social History[1] Social History   Social History Narrative   Not on file     Immunization History  Administered Date(s) Administered   Moderna Sars-Covid-2 Vaccination 12/26/2019, 01/23/2020  Tdap 02/28/2012     Objective: Vital Signs: BP 132/88   Pulse 87   Temp 97.8 F (36.6 C)   Resp 16   Ht 5' (1.524 m)   Wt 251 lb 9.6 oz (114.1 kg)   BMI 49.14 kg/m    Physical Exam Vitals and nursing note reviewed.  Constitutional:      Appearance: She is well-developed.  HENT:     Head: Normocephalic and atraumatic.  Eyes:     Conjunctiva/sclera: Conjunctivae normal.  Cardiovascular:     Rate and Rhythm: Normal rate and regular rhythm.     Heart sounds: Normal heart  sounds.  Pulmonary:     Effort: Pulmonary effort is normal.     Breath sounds: Normal breath sounds.  Abdominal:     General: Bowel sounds are normal.     Palpations: Abdomen is soft.  Musculoskeletal:     Cervical back: Normal range of motion.  Lymphadenopathy:     Cervical: No cervical adenopathy.  Skin:    General: Skin is warm and dry.     Capillary Refill: Capillary refill takes less than 2 seconds.  Neurological:     Mental Status: She is alert and oriented to person, place, and time.  Psychiatric:        Behavior: Behavior normal.      Musculoskeletal Exam: Cervical, thoracic and lumbar spine were in good range of motion.  There was no SI joint tenderness.  Shoulder joints, elbow joints, wrist joints, MCPs, PIPs and DIPs were in good range of motion with no synovitis.  Hip joints and knee joints were in good range of motion without any warmth swelling or effusion.  There was no tenderness over ankles or MTPs.   CDAI Exam: CDAI Score: -- Patient Global: --; Provider Global: -- Swollen: --; Tender: -- Joint Exam 09/13/2024   No joint exam has been documented for this visit   There is currently no information documented on the homunculus. Go to the Rheumatology activity and complete the homunculus joint exam.  Investigation: No additional findings.  Imaging: No results found.  Recent Labs: Lab Results  Component Value Date   WBC 4.7 10/14/2023   HGB 12.8 10/14/2023   PLT 268.0 10/14/2023   NA 138 10/14/2023   K 3.9 10/14/2023   CL 103 10/14/2023   CO2 28 10/14/2023   GLUCOSE 78 10/14/2023   BUN 11 10/14/2023   CREATININE 0.83 10/14/2023   BILITOT 0.4 10/14/2023   ALKPHOS 85 10/14/2023   AST 14 10/14/2023   ALT 15 10/14/2023   PROT 7.5 10/14/2023   ALBUMIN 4.1 10/14/2023   CALCIUM 9.2 10/14/2023   September 09, 2024 ANA 1: 80 NS, SSA 1.9, dsDNA negative, C3-C4 normal   September 17, 2023 SSA 2.0, SSB negative, dsDNA negative, RNP negative, Smith negative,  SCL 70 negative, antichromatin negative, anticentromere negative, Jo 1 negative, RF negative   Speciality Comments: No specialty comments available.  Procedures:  No procedures performed Allergies: Patient has no known allergies.   Assessment / Plan:     Visit Diagnoses: Positive ANA (antinuclear antibody) - September 09, 2024 ANA 1: 80 NS, SSA 1.9, dsDNA negative, C3-C4 normal.  Lab results were reviewed with the patient.  She continues to have low titer positive ANA and SSA antibody.  She does not have any symptoms of Sjogren's.  She denies sicca symptoms, joint inflammation, lymphadenopathy, shortness of breath, rash.  I advised her to contact me if she develops any new symptoms.  Will recheck labs in 6 months.  She had EKG in the past which was normal.  Association of SSA antibody with sicca symptoms, ILD, lymphadenopathy, lymphoma and inflammatory arthritis was discussed.  Plan: Urinalysis, Routine w reflex microscopic, CBC with Differential/Platelet, Comprehensive metabolic panel with GFR, ANA, Anti-DNA antibody, double-stranded, C3 and C4, Sedimentation rate, Sjogrens syndrome-A extractable nuclear antibody, Serum protein electrophoresis with reflex  Pain in both hands-improved.  No synovitis was noted.  Neck pain-she reports some stiffness in her neck.  She had good range of motion.  A handout on neck exercises was given.  Lumbar spondylosis-she has intermittent discomfort in her lower back.  A handout on back exercises was given.  Other fatigue-improved.  Thyromegaly  Constipation, chronic  Orders: Orders Placed This Encounter  Procedures   Urinalysis, Routine w reflex microscopic   CBC with Differential/Platelet   Comprehensive metabolic panel with GFR   ANA   Anti-DNA antibody, double-stranded   C3 and C4   Sedimentation rate   Sjogrens syndrome-A extractable nuclear antibody   Serum protein electrophoresis with reflex   No orders of the defined types were placed in this  encounter.    Follow-Up Instructions: Return in about 6 months (around 03/13/2025) for Positive ANA.   Maya Nash, MD  Note - This record has been created using Animal nutritionist.  Chart creation errors have been sought, but may not always  have been located. Such creation errors do not reflect on  the standard of medical care.     [1]  Social History Tobacco Use   Smoking status: Never    Passive exposure: Past   Smokeless tobacco: Never  Vaping Use   Vaping status: Never Used  Substance Use Topics   Alcohol use: Yes    Comment: occ   Drug use: No   "

## 2024-09-06 ENCOUNTER — Ambulatory Visit: Admitting: Family Medicine

## 2024-09-06 ENCOUNTER — Ambulatory Visit: Payer: Self-pay

## 2024-09-06 ENCOUNTER — Encounter: Payer: Self-pay | Admitting: Family Medicine

## 2024-09-06 VITALS — BP 128/84 | HR 72 | Ht 60.0 in | Wt 247.0 lb

## 2024-09-06 DIAGNOSIS — S76011A Strain of muscle, fascia and tendon of right hip, initial encounter: Secondary | ICD-10-CM | POA: Diagnosis not present

## 2024-09-06 MED ORDER — CYCLOBENZAPRINE HCL 10 MG PO TABS: 10.0000 mg | ORAL_TABLET | Freq: Every day | ORAL | 0 refills | Status: AC

## 2024-09-06 MED ORDER — NAPROXEN 500 MG PO TABS: 500.0000 mg | ORAL_TABLET | Freq: Two times a day (BID) | ORAL | 0 refills | Status: AC

## 2024-09-06 MED ORDER — KETOROLAC TROMETHAMINE 30 MG/ML IJ SOLN
30.0000 mg | Freq: Once | INTRAMUSCULAR | Status: AC
  Administered 2024-09-06: 30 mg via INTRAMUSCULAR

## 2024-09-06 NOTE — Telephone Encounter (Signed)
 Copied from CRM [id number]. Topic: Clinical - Red Word Triage >> Sep 06, 2024  8:27 AM Mesmerise C wrote: [facility] that prompted transfer to Nurse Triage: Patient is experiencing front right hip pain and weakness in right leg, states when she takes a deep breathe feels a sharp pain in her hip Reason for Disposition  Numbness in a leg or foot (i.e., loss of sensation)  Answer Assessment - Initial Assessment Questions No available appts with pcp. Scheduled 09/06/24. Advised call back or ED/911 if symptoms worsen. Patient verbalized understanding.    1. LOCATION and RADIATION: Where is the pain located? Does the pain spread (shoot) anywhere else?     Right hip 2. QUALITY: What does the pain feel like?  (e.g., sharp, dull, aching, burning)     Dull ache, standing up straightsharp 3. SEVERITY: How bad is the pain? What does it keep you from doing?   (Scale 1-10; or mild, moderate, severe)     5/10 when standing, 800 mg motrin  4. ONSET: When did the pain start? Does it come and go, or is it there all the time?     sunday 5. WORK OR EXERCISE: Has there been any recent work or exercise that involved this part of the body?      Stretching, leg popped 6. CAUSE: What do you think is causing the hip pain?      stretching 7. AGGRAVATING FACTORS: What makes the hip pain worse? (e.g., walking, climbing stairs, running)     standing 8. OTHER SYMPTOMS: Do you have any other symptoms? (e.g., back pain, pain shooting down leg,  fever, rash)     denies  Protocols used: Hip Pain-A-AH

## 2024-09-06 NOTE — Progress Notes (Signed)
 "  Acute Office Visit  Subjective:     Patient ID: Paula Rojas, female    DOB: 06-Feb-1981, 43 y.o.   MRN: [id number]  Chief Complaint  Patient presents with   Hip Pain    Right hip and leg pain and lower    HPI Patient is in today for right hip, right leg, and lower back pain. Hip and back pain is not new, usually stretches in the mornings.  Pain started with stretching on Sunday. Heard a pop in pelvis area that radiates towards right groin.  Pain is located around right hip bone. Affecting walking. Last ibuprofen  800 mg at 2100 last night.  Did not help pain. Heating pad helped some.  Declines pelvic x-rays today. Hip ROM intact, guarded due to pain in hip flexor.         ROS      Objective:    BP 128/84   Pulse 72   Ht 5' (1.524 m)   Wt 247 lb (112 kg)   SpO2 99%   BMI 48.24 kg/m    Physical Exam Vitals and nursing note reviewed.  Constitutional:      General: She is not in acute distress.    Appearance: Normal appearance.  Pulmonary:     Effort: Pulmonary effort is normal.  Musculoskeletal:        General: Tenderness present.     Comments: Tenderness to palpation in right hip flexor. ROM intact to right hip, able to adduct and abduct.  Skin:    General: Skin is warm and dry.  Neurological:     General: No focal deficit present.     Mental Status: She is alert. Mental status is at baseline.  Psychiatric:        Mood and Affect: Mood normal.        Behavior: Behavior normal.        Thought Content: Thought content normal.        Judgment: Judgment normal.     No results found for any visits on 09/06/24.      Assessment & Plan:   Problem List Items Addressed This Visit     Strain of flexor muscle of right hip - Primary   Routine stretching of hip flexor on Sunday due to chronic low back pain produced popping and pain in right hip flexor. No trauma, no indication for x-ray today. There is tenderness to palpation in right hip flexor  area. Recommend conservative treatment to include: Toradol  30 mg IM today. Naproxen 500 mg BID with food to start 24 hours after injection today. Flexeril  10 mg as needed at bedtime.  RICE. Rest for next 24 hours with gently movement for ADLs.  Understands this can take some time to resolve. Follow-up with this provider or her PCP if needed.         Relevant Medications   naproxen (NAPROSYN) 500 MG tablet   cyclobenzaprine  (FLEXERIL ) 10 MG tablet    Meds ordered this encounter  Medications   ketorolac  (TORADOL ) 30 MG/ML injection 30 mg   naproxen (NAPROSYN) 500 MG tablet    Sig: Take 1 tablet (500 mg total) by mouth 2 (two) times daily with a meal. Start on 12/31    Dispense:  30 tablet    Refill:  0    Supervising Provider:   METHENEY, CATHERINE D [[id number]]   cyclobenzaprine  (FLEXERIL ) 10 MG tablet    Sig: Take 1 tablet (10 mg total) by mouth at bedtime.  Dispense:  10 tablet    Refill:  0    Supervising Provider:   METHENEY, CATHERINE D [[id number]]  Agrees with plan of care discussed.  Questions answered.   Return if symptoms worsen or fail to improve.  Darice JONELLE Brownie, FNP   "

## 2024-09-06 NOTE — Assessment & Plan Note (Signed)
 Routine stretching of hip flexor on Sunday due to chronic low back pain produced popping and pain in right hip flexor. No trauma, no indication for x-ray today. There is tenderness to palpation in right hip flexor area. Recommend conservative treatment to include: Toradol  30 mg IM today. Naproxen 500 mg BID with food to start 24 hours after injection today. Flexeril  10 mg as needed at bedtime.  RICE. Rest for next 24 hours with gently movement for ADLs.  Understands this can take some time to resolve. Follow-up with this provider or her PCP if needed.

## 2024-09-07 NOTE — Telephone Encounter (Signed)
 Noted. Patient seen at [facility] yesterday.

## 2024-09-09 ENCOUNTER — Other Ambulatory Visit: Payer: Self-pay | Admitting: *Deleted

## 2024-09-09 DIAGNOSIS — M2550 Pain in unspecified joint: Secondary | ICD-10-CM

## 2024-09-11 LAB — C3 AND C4
C3 Complement: 155 mg/dL (ref 83–193)
C4 Complement: 29 mg/dL (ref 15–57)

## 2024-09-11 LAB — ANTI-NUCLEAR AB-TITER (ANA TITER): ANA Titer 1: 1:80 {titer} — ABNORMAL HIGH

## 2024-09-11 LAB — ANA: Anti Nuclear Antibody (ANA): POSITIVE — AB

## 2024-09-11 LAB — SJOGRENS SYNDROME-A EXTRACTABLE NUCLEAR ANTIBODY: SSA (Ro) (ENA) Antibody, IgG: 1.9 AI — AB

## 2024-09-11 LAB — ANTI-DNA ANTIBODY, DOUBLE-STRANDED: ds DNA Ab: 1 [IU]/mL

## 2024-09-12 ENCOUNTER — Ambulatory Visit: Payer: Self-pay | Admitting: Rheumatology

## 2024-09-12 NOTE — Progress Notes (Signed)
 I will discuss results at the follow-up visit on January 6.

## 2024-09-13 ENCOUNTER — Ambulatory Visit: Attending: Rheumatology | Admitting: Rheumatology

## 2024-09-13 ENCOUNTER — Encounter: Payer: Self-pay | Admitting: Rheumatology

## 2024-09-13 VITALS — BP 132/88 | HR 87 | Temp 97.8°F | Resp 16 | Ht 60.0 in | Wt 251.6 lb

## 2024-09-13 DIAGNOSIS — K5909 Other constipation: Secondary | ICD-10-CM | POA: Diagnosis not present

## 2024-09-13 DIAGNOSIS — M47816 Spondylosis without myelopathy or radiculopathy, lumbar region: Secondary | ICD-10-CM

## 2024-09-13 DIAGNOSIS — M79641 Pain in right hand: Secondary | ICD-10-CM

## 2024-09-13 DIAGNOSIS — M542 Cervicalgia: Secondary | ICD-10-CM

## 2024-09-13 DIAGNOSIS — E010 Iodine-deficiency related diffuse (endemic) goiter: Secondary | ICD-10-CM | POA: Diagnosis not present

## 2024-09-13 DIAGNOSIS — R5383 Other fatigue: Secondary | ICD-10-CM

## 2024-09-13 DIAGNOSIS — M79642 Pain in left hand: Secondary | ICD-10-CM

## 2024-09-13 DIAGNOSIS — R7689 Other specified abnormal immunological findings in serum: Secondary | ICD-10-CM | POA: Diagnosis not present

## 2024-09-13 NOTE — Patient Instructions (Signed)
 Cervical Strain and Sprain Rehab Ask your health care provider which exercises are safe for you. Do exercises exactly as told by your health care provider and adjust them as directed. It is normal to feel mild stretching, pulling, tightness, or discomfort as you do these exercises. Stop right away if you feel sudden pain or your pain gets worse. Do not begin these exercises until told by your health care provider. Stretching and range-of-motion exercises Cervical side bending  Using good posture, sit on a stable chair or stand up. Without moving your shoulders, slowly tilt your left / right ear to your shoulder until you feel a stretch in the neck muscles on the opposite side. You should be looking straight ahead. Hold for __________ seconds. Repeat with the other side of your neck. Repeat __________ times. Complete this exercise __________ times a day. Cervical rotation  Using good posture, sit on a stable chair or stand up. Slowly turn your head to the side as if you are looking over your left / right shoulder. Keep your eyes level with the ground. Stop when you feel a stretch along the side and the back of your neck. Hold for __________ seconds. Repeat this by turning to your other side. Repeat __________ times. Complete this exercise __________ times a day. Thoracic extension and pectoral stretch  Roll a towel or a small blanket so it is about 4 inches (10 cm) in diameter. Lie down on your back on a firm surface. Put the towel in the middle of your back across your spine. It should not be under your shoulder blades. Put your hands behind your head and let your elbows fall out to your sides. Hold for __________ seconds. Repeat __________ times. Complete this exercise __________ times a day. Strengthening exercises Upper cervical flexion  Lie on your back with a thin pillow behind your head or a small, rolled-up towel under your neck. Gently tuck your chin toward your chest and nod  your head down to look toward your feet. Do not lift your head off the pillow. Hold for __________ seconds. Release the tension slowly. Relax your neck muscles completely before you repeat this exercise. Repeat __________ times. Complete this exercise __________ times a day. Cervical extension  Stand about 6 inches (15 cm) away from a wall, with your back facing the wall. Place a soft object, about 6-8 inches (15-20 cm) in diameter, between the back of your head and the wall. A soft object could be a small pillow, a ball, or a folded towel. Gently tilt your head back and press into the soft object. Keep your jaw and forehead relaxed. Hold for __________ seconds. Release the tension slowly. Relax your neck muscles completely before you repeat this exercise. Repeat __________ times. Complete this exercise __________ times a day. Posture and body mechanics Body mechanics refer to the movements and positions of your body while you do your daily activities. Posture is part of body mechanics. Good posture and healthy body mechanics can help to relieve stress in your body's tissues and joints. Good posture means that your spine is in its natural S-curve position (your spine is neutral), your shoulders are pulled back slightly, and your head is not tipped forward. The following are general guidelines for using improved posture and body mechanics in your everyday activities. Sitting  When sitting, keep your spine neutral and keep your feet flat on the floor. Use a footrest, if needed, and keep your thighs parallel to the floor. Avoid rounding  your shoulders. Avoid tilting your head forward. When working at a desk or a computer, keep your desk at a height where your hands are slightly lower than your elbows. Slide your chair under your desk so you are close enough to maintain good posture. When working at a computer, place your monitor at a height where you are looking straight ahead and you do not have to  tilt your head forward or downward to look at the screen. Standing  When standing, keep your spine neutral and keep your feet about hip-width apart. Keep a slight bend in your knees. Your ears, shoulders, and hips should line up. When you do a task in which you stand in one place for a long time, place one foot up on a stable object that is 2-4 inches (5-10 cm) high, such as a footstool. This helps keep your spine neutral. Resting When lying down and resting, avoid positions that are most painful for you. Try to support your neck in a neutral position. You can use a contour pillow or a small rolled-up towel. Your pillow should support your neck but not push on it. This information is not intended to replace advice given to you by your health care provider. Make sure you discuss any questions you have with your health care provider. Document Revised: 12/29/2022 Document Reviewed: 03/17/2022 [employer] Patient Education  2024 [employer] Low Back Sprain or Strain Rehab Ask your health care provider which exercises are safe for you. Do exercises exactly as told by your health care provider and adjust them as directed. It is normal to feel mild stretching, pulling, tightness, or discomfort as you do these exercises. Stop right away if you feel sudden pain or your pain gets worse. Do not begin these exercises until told by your health care provider. Stretching and range-of-motion exercises These exercises warm up your muscles and joints and improve the movement and flexibility of your back. These exercises also help to relieve pain, numbness, and tingling. Lumbar rotation  Lie on your back on a firm bed or the floor with your knees bent. Straighten your arms out to your sides so each arm forms a 90-degree angle (right angle) with a side of your body. Slowly move (rotate) both of your knees to one side of your body until you feel a stretch in your lower back (lumbar). Try not to let your shoulders lift  off the floor. Hold this position for __________ seconds. Tense your abdominal muscles and slowly move your knees back to the starting position. Repeat this exercise on the other side of your body. Repeat __________ times. Complete this exercise __________ times a day. Single knee to chest  Lie on your back on a firm bed or the floor with both legs straight. Bend one of your knees. Use your hands to move your knee up toward your chest until you feel a gentle stretch in your lower back and buttock. Hold your leg in this position by holding on to the front of your knee. Keep your other leg as straight as possible. Hold this position for __________ seconds. Slowly return to the starting position. Repeat with your other leg. Repeat __________ times. Complete this exercise __________ times a day. Prone extension on elbows  Lie on your abdomen on a firm bed or the floor (prone position). Prop yourself up on your elbows. Use your arms to help lift your chest up until you feel a gentle stretch in your abdomen and your lower back.  This will place some of your body weight on your elbows. If this is uncomfortable, try stacking pillows under your chest. Your hips should stay down, against the surface that you are lying on. Keep your hip and back muscles relaxed. Hold this position for __________ seconds. Slowly relax your upper body and return to the starting position. Repeat __________ times. Complete this exercise __________ times a day. Strengthening exercises These exercises build strength and endurance in your back. Endurance is the ability to use your muscles for a long time, even after they get tired. Pelvic tilt This exercise strengthens the muscles that lie deep in the abdomen. Lie on your back on a firm bed or the floor with your legs extended. Bend your knees so they are pointing toward the ceiling and your feet are flat on the floor. Tighten your lower abdominal muscles to press your  lower back against the floor. This motion will tilt your pelvis so your tailbone points up toward the ceiling instead of pointing to your feet or the floor. To help with this exercise, you may place a small towel under your lower back and try to push your back into the towel. Hold this position for __________ seconds. Let your muscles relax completely before you repeat this exercise. Repeat __________ times. Complete this exercise __________ times a day. Alternating arm and leg raises  Get on your hands and knees on a firm surface. If you are on a hard floor, you may want to use padding, such as an exercise mat, to cushion your knees. Line up your arms and legs. Your hands should be directly below your shoulders, and your knees should be directly below your hips. Lift your left leg behind you. At the same time, raise your right arm and straighten it in front of you. Do not lift your leg higher than your hip. Do not lift your arm higher than your shoulder. Keep your abdominal and back muscles tight. Keep your hips facing the ground. Do not arch your back. Keep your balance carefully, and do not hold your breath. Hold this position for __________ seconds. Slowly return to the starting position. Repeat with your right leg and your left arm. Repeat __________ times. Complete this exercise __________ times a day. Abdominal set with straight leg raise  Lie on your back on a firm bed or the floor. Bend one of your knees and keep your other leg straight. Tense your abdominal muscles and lift your straight leg up, 4-6 inches (10-15 cm) off the ground. Keep your abdominal muscles tight and hold this position for __________ seconds. Do not hold your breath. Do not arch your back. Keep it flat against the ground. Keep your abdominal muscles tense as you slowly lower your leg back to the starting position. Repeat with your other leg. Repeat __________ times. Complete this exercise __________ times a  day. Single leg lower with bent knees Lie on your back on a firm bed or the floor. Tense your abdominal muscles and lift your feet off the floor, one foot at a time, so your knees and hips are bent in 90-degree angles (right angles). Your knees should be over your hips and your lower legs should be parallel to the floor. Keeping your abdominal muscles tense and your knee bent, slowly lower one of your legs so your toe touches the ground. Lift your leg back up to return to the starting position. Do not hold your breath. Do not let your back arch. Keep  your back flat against the ground. Repeat with your other leg. Repeat __________ times. Complete this exercise __________ times a day. Posture and body mechanics Good posture and healthy body mechanics can help to relieve stress in your body's tissues and joints. Body mechanics refers to the movements and positions of your body while you do your daily activities. Posture is part of body mechanics. Good posture means: Your spine is in its natural S-curve position (neutral). Your shoulders are pulled back slightly. Your head is not tipped forward (neutral). Follow these guidelines to improve your posture and body mechanics in your everyday activities. Standing  When standing, keep your spine neutral and your feet about hip-width apart. Keep a slight bend in your knees. Your ears, shoulders, and hips should line up. When you do a task in which you stand in one place for a long time, place one foot up on a stable object that is 2-4 inches (5-10 cm) high, such as a footstool. This helps keep your spine neutral. Sitting  When sitting, keep your spine neutral and keep your feet flat on the floor. Use a footrest, if necessary, and keep your thighs parallel to the floor. Avoid rounding your shoulders, and avoid tilting your head forward. When working at a desk or a computer, keep your desk at a height where your hands are slightly lower than your elbows.  Slide your chair under your desk so you are close enough to maintain good posture. When working at a computer, place your monitor at a height where you are looking straight ahead and you do not have to tilt your head forward or downward to look at the screen. Resting When lying down and resting, avoid positions that are most painful for you. If you have pain with activities such as sitting, bending, stooping, or squatting, lie in a position in which your body does not bend very much. For example, avoid curling up on your side with your arms and knees near your chest (fetal position). If you have pain with activities such as standing for a long time or reaching with your arms, lie with your spine in a neutral position and bend your knees slightly. Try the following positions: Lying on your side with a pillow between your knees. Lying on your back with a pillow under your knees. Lifting  When lifting objects, keep your feet at least shoulder-width apart and tighten your abdominal muscles. Bend your knees and hips and keep your spine neutral. It is important to lift using the strength of your legs, not your back. Do not lock your knees straight out. Always ask for help to lift heavy or awkward objects. This information is not intended to replace advice given to you by your health care provider. Make sure you discuss any questions you have with your health care provider. Document Revised: 12/29/2022 Document Reviewed: 11/12/2020 [employer] Patient Education  2024 [employer].

## 2024-10-17 ENCOUNTER — Ambulatory Visit: Payer: BC Managed Care – PPO | Admitting: Nurse Practitioner

## 2024-10-19 ENCOUNTER — Encounter: Admitting: Nurse Practitioner

## 2025-03-14 ENCOUNTER — Ambulatory Visit: Admitting: Rheumatology
